# Patient Record
Sex: Female | Born: 1970 | Race: Black or African American | Hispanic: No | Marital: Single | Smoking: Never smoker
Health system: Southern US, Community
[De-identification: ages and names within clinical notes are randomized; demographics above are authoritative.]

## PROBLEM LIST (undated history)

## (undated) DIAGNOSIS — N200 Calculus of kidney: Secondary | ICD-10-CM

## (undated) DIAGNOSIS — F32A Depression, unspecified: Secondary | ICD-10-CM

## (undated) DIAGNOSIS — E119 Type 2 diabetes mellitus without complications: Secondary | ICD-10-CM

## (undated) DIAGNOSIS — K589 Irritable bowel syndrome without diarrhea: Secondary | ICD-10-CM

## (undated) DIAGNOSIS — C8590 Non-Hodgkin lymphoma, unspecified, unspecified site: Secondary | ICD-10-CM

## (undated) DIAGNOSIS — F329 Major depressive disorder, single episode, unspecified: Secondary | ICD-10-CM

## (undated) HISTORY — PX: LAPAROSCOPY: SHX197

## (undated) HISTORY — PX: ABDOMINAL HYSTERECTOMY: SHX81

## (undated) HISTORY — PX: LITHOTRIPSY: SUR834

---

## 2001-08-21 ENCOUNTER — Emergency Department (HOSPITAL_COMMUNITY): Admission: EM | Admit: 2001-08-21 | Discharge: 2001-08-21 | Payer: Self-pay | Admitting: Emergency Medicine

## 2001-10-03 ENCOUNTER — Encounter: Payer: Self-pay | Admitting: Emergency Medicine

## 2001-10-03 ENCOUNTER — Emergency Department (HOSPITAL_COMMUNITY): Admission: EM | Admit: 2001-10-03 | Discharge: 2001-10-03 | Payer: Self-pay | Admitting: Emergency Medicine

## 2002-01-30 ENCOUNTER — Other Ambulatory Visit: Admission: RE | Admit: 2002-01-30 | Discharge: 2002-01-30 | Payer: Self-pay | Admitting: Family Medicine

## 2002-03-04 ENCOUNTER — Emergency Department (HOSPITAL_COMMUNITY): Admission: EM | Admit: 2002-03-04 | Discharge: 2002-03-05 | Payer: Self-pay

## 2002-04-22 ENCOUNTER — Encounter: Admission: RE | Admit: 2002-04-22 | Discharge: 2002-04-22 | Payer: Self-pay | Admitting: Internal Medicine

## 2002-04-22 ENCOUNTER — Encounter: Payer: Self-pay | Admitting: Internal Medicine

## 2002-05-01 ENCOUNTER — Emergency Department (HOSPITAL_COMMUNITY): Admission: EM | Admit: 2002-05-01 | Discharge: 2002-05-02 | Payer: Self-pay | Admitting: Emergency Medicine

## 2002-05-20 ENCOUNTER — Inpatient Hospital Stay (HOSPITAL_COMMUNITY): Admission: AD | Admit: 2002-05-20 | Discharge: 2002-05-21 | Payer: Self-pay | Admitting: Obstetrics and Gynecology

## 2002-05-21 ENCOUNTER — Encounter: Payer: Self-pay | Admitting: Obstetrics

## 2002-07-20 ENCOUNTER — Emergency Department (HOSPITAL_COMMUNITY): Admission: EM | Admit: 2002-07-20 | Discharge: 2002-07-20 | Payer: Self-pay | Admitting: Emergency Medicine

## 2002-07-26 ENCOUNTER — Emergency Department (HOSPITAL_COMMUNITY): Admission: EM | Admit: 2002-07-26 | Discharge: 2002-07-27 | Payer: Self-pay | Admitting: *Deleted

## 2002-08-27 ENCOUNTER — Encounter: Admission: RE | Admit: 2002-08-27 | Discharge: 2002-08-27 | Payer: Self-pay | Admitting: Family Medicine

## 2002-11-17 ENCOUNTER — Emergency Department (HOSPITAL_COMMUNITY): Admission: EM | Admit: 2002-11-17 | Discharge: 2002-11-17 | Payer: Self-pay | Admitting: Emergency Medicine

## 2003-09-14 ENCOUNTER — Emergency Department (HOSPITAL_COMMUNITY): Admission: EM | Admit: 2003-09-14 | Discharge: 2003-09-14 | Payer: Self-pay | Admitting: Emergency Medicine

## 2003-12-25 ENCOUNTER — Emergency Department (HOSPITAL_COMMUNITY): Admission: EM | Admit: 2003-12-25 | Discharge: 2003-12-25 | Payer: Self-pay | Admitting: Emergency Medicine

## 2004-01-12 ENCOUNTER — Emergency Department (HOSPITAL_COMMUNITY): Admission: EM | Admit: 2004-01-12 | Discharge: 2004-01-12 | Payer: Self-pay | Admitting: Family Medicine

## 2004-01-13 ENCOUNTER — Emergency Department (HOSPITAL_COMMUNITY): Admission: EM | Admit: 2004-01-13 | Discharge: 2004-01-13 | Payer: Self-pay | Admitting: Family Medicine

## 2004-02-02 ENCOUNTER — Inpatient Hospital Stay (HOSPITAL_COMMUNITY): Admission: AD | Admit: 2004-02-02 | Discharge: 2004-02-02 | Payer: Self-pay | Admitting: Unknown Physician Specialty

## 2004-02-16 ENCOUNTER — Ambulatory Visit: Payer: Self-pay | Admitting: *Deleted

## 2004-04-14 ENCOUNTER — Ambulatory Visit: Payer: Self-pay | Admitting: Obstetrics and Gynecology

## 2004-04-16 ENCOUNTER — Emergency Department (HOSPITAL_COMMUNITY): Admission: EM | Admit: 2004-04-16 | Discharge: 2004-04-16 | Payer: Self-pay | Admitting: Family Medicine

## 2004-05-13 ENCOUNTER — Ambulatory Visit: Payer: Self-pay | Admitting: Family Medicine

## 2004-05-15 ENCOUNTER — Inpatient Hospital Stay (HOSPITAL_COMMUNITY): Admission: AD | Admit: 2004-05-15 | Discharge: 2004-05-15 | Payer: Self-pay | Admitting: Family Medicine

## 2004-05-20 ENCOUNTER — Inpatient Hospital Stay (HOSPITAL_COMMUNITY): Admission: AD | Admit: 2004-05-20 | Discharge: 2004-05-20 | Payer: Self-pay | Admitting: Obstetrics and Gynecology

## 2004-05-31 ENCOUNTER — Emergency Department (HOSPITAL_COMMUNITY): Admission: EM | Admit: 2004-05-31 | Discharge: 2004-05-31 | Payer: Self-pay | Admitting: Family Medicine

## 2004-06-07 ENCOUNTER — Ambulatory Visit: Payer: Self-pay | Admitting: Obstetrics and Gynecology

## 2004-06-14 ENCOUNTER — Ambulatory Visit: Payer: Self-pay | Admitting: Family Medicine

## 2004-06-17 ENCOUNTER — Ambulatory Visit: Payer: Self-pay | Admitting: *Deleted

## 2004-06-20 ENCOUNTER — Emergency Department (HOSPITAL_COMMUNITY): Admission: EM | Admit: 2004-06-20 | Discharge: 2004-06-20 | Payer: Self-pay | Admitting: Family Medicine

## 2004-06-28 ENCOUNTER — Ambulatory Visit: Payer: Self-pay | Admitting: *Deleted

## 2004-07-15 ENCOUNTER — Inpatient Hospital Stay (HOSPITAL_COMMUNITY): Admission: AD | Admit: 2004-07-15 | Discharge: 2004-07-16 | Payer: Self-pay | Admitting: Obstetrics and Gynecology

## 2004-08-12 ENCOUNTER — Emergency Department (HOSPITAL_COMMUNITY): Admission: EM | Admit: 2004-08-12 | Discharge: 2004-08-12 | Payer: Self-pay | Admitting: Family Medicine

## 2004-09-12 ENCOUNTER — Inpatient Hospital Stay (HOSPITAL_COMMUNITY): Admission: AD | Admit: 2004-09-12 | Discharge: 2004-09-12 | Payer: Self-pay | Admitting: Obstetrics & Gynecology

## 2004-09-23 ENCOUNTER — Emergency Department (HOSPITAL_COMMUNITY): Admission: EM | Admit: 2004-09-23 | Discharge: 2004-09-23 | Payer: Self-pay | Admitting: Family Medicine

## 2004-10-08 ENCOUNTER — Emergency Department (HOSPITAL_COMMUNITY): Admission: EM | Admit: 2004-10-08 | Discharge: 2004-10-08 | Payer: Self-pay | Admitting: Family Medicine

## 2004-11-14 ENCOUNTER — Emergency Department (HOSPITAL_COMMUNITY): Admission: EM | Admit: 2004-11-14 | Discharge: 2004-11-14 | Payer: Self-pay | Admitting: Emergency Medicine

## 2004-12-16 ENCOUNTER — Emergency Department (HOSPITAL_COMMUNITY): Admission: EM | Admit: 2004-12-16 | Discharge: 2004-12-16 | Payer: Self-pay | Admitting: Family Medicine

## 2005-01-09 ENCOUNTER — Emergency Department (HOSPITAL_COMMUNITY): Admission: EM | Admit: 2005-01-09 | Discharge: 2005-01-09 | Payer: Self-pay | Admitting: Emergency Medicine

## 2005-01-24 ENCOUNTER — Encounter: Payer: Self-pay | Admitting: Obstetrics and Gynecology

## 2005-01-24 ENCOUNTER — Ambulatory Visit: Payer: Self-pay | Admitting: Obstetrics and Gynecology

## 2005-01-28 ENCOUNTER — Emergency Department (HOSPITAL_COMMUNITY): Admission: EM | Admit: 2005-01-28 | Discharge: 2005-01-28 | Payer: Self-pay | Admitting: Family Medicine

## 2005-02-14 ENCOUNTER — Ambulatory Visit: Payer: Self-pay | Admitting: Internal Medicine

## 2005-03-08 ENCOUNTER — Emergency Department (HOSPITAL_COMMUNITY): Admission: AD | Admit: 2005-03-08 | Discharge: 2005-03-08 | Payer: Self-pay | Admitting: Family Medicine

## 2005-06-11 ENCOUNTER — Emergency Department (HOSPITAL_COMMUNITY): Admission: EM | Admit: 2005-06-11 | Discharge: 2005-06-11 | Payer: Self-pay | Admitting: Emergency Medicine

## 2005-06-28 ENCOUNTER — Ambulatory Visit: Payer: Self-pay | Admitting: Family Medicine

## 2005-07-05 ENCOUNTER — Ambulatory Visit (HOSPITAL_COMMUNITY): Admission: RE | Admit: 2005-07-05 | Discharge: 2005-07-05 | Payer: Self-pay | Admitting: Obstetrics and Gynecology

## 2005-07-26 ENCOUNTER — Ambulatory Visit: Payer: Self-pay | Admitting: Obstetrics and Gynecology

## 2005-08-03 ENCOUNTER — Emergency Department (HOSPITAL_COMMUNITY): Admission: EM | Admit: 2005-08-03 | Discharge: 2005-08-03 | Payer: Self-pay | Admitting: Pediatrics

## 2005-08-08 ENCOUNTER — Ambulatory Visit (HOSPITAL_COMMUNITY): Admission: RE | Admit: 2005-08-08 | Discharge: 2005-08-08 | Payer: Self-pay | Admitting: Emergency Medicine

## 2005-08-21 ENCOUNTER — Encounter (INDEPENDENT_AMBULATORY_CARE_PROVIDER_SITE_OTHER): Payer: Self-pay | Admitting: *Deleted

## 2005-08-21 ENCOUNTER — Inpatient Hospital Stay (HOSPITAL_COMMUNITY): Admission: RE | Admit: 2005-08-21 | Discharge: 2005-08-23 | Payer: Self-pay | Admitting: Obstetrics and Gynecology

## 2005-08-21 ENCOUNTER — Ambulatory Visit: Payer: Self-pay | Admitting: Family Medicine

## 2005-08-28 ENCOUNTER — Inpatient Hospital Stay (HOSPITAL_COMMUNITY): Admission: AD | Admit: 2005-08-28 | Discharge: 2005-08-28 | Payer: Self-pay | Admitting: Gynecology

## 2005-09-07 ENCOUNTER — Ambulatory Visit: Payer: Self-pay | Admitting: Obstetrics and Gynecology

## 2005-10-23 ENCOUNTER — Emergency Department (HOSPITAL_COMMUNITY): Admission: EM | Admit: 2005-10-23 | Discharge: 2005-10-23 | Payer: Self-pay | Admitting: Emergency Medicine

## 2005-10-26 ENCOUNTER — Ambulatory Visit: Payer: Self-pay | Admitting: Obstetrics and Gynecology

## 2005-11-22 ENCOUNTER — Emergency Department (HOSPITAL_COMMUNITY): Admission: EM | Admit: 2005-11-22 | Discharge: 2005-11-23 | Payer: Self-pay | Admitting: Emergency Medicine

## 2005-12-24 ENCOUNTER — Emergency Department (HOSPITAL_COMMUNITY): Admission: EM | Admit: 2005-12-24 | Discharge: 2005-12-24 | Payer: Self-pay | Admitting: Emergency Medicine

## 2006-02-22 ENCOUNTER — Emergency Department (HOSPITAL_COMMUNITY): Admission: EM | Admit: 2006-02-22 | Discharge: 2006-02-22 | Payer: Self-pay | Admitting: Family Medicine

## 2006-02-26 IMAGING — US US TRANSVAGINAL NON-OB
1 series · 18 of 25 positions shown · non-contrast
Comparison: none

CLINICAL DATA: Pelvic pain and enlarged uterus. 
 TRANSABDOMINAL AND TRANSVAGINAL PELVIC ULTRASOUND:
 No available comparisons. 
 The uterus measures 9.0 x 4.5 x 5.2 cm.  There is mild heterogeneity but no focal abnormality.  The endometrium is normal at 9.7 mm.  The right ovary is normal measuring 2.6 x 1.5 x 3.5 cm.  The left ovary is enlarged measuring 4.7 x 3.3 x 3.5 cm.  The left ovary contains a simple-appearing cyst measuring 3.7 x 3.1 x 3.2 cm.  No free fluid.  No other adnexal abnormality.

[Series 1: us transvaginal non-ob · 18 of 61 slices shown]
[im 1/61]
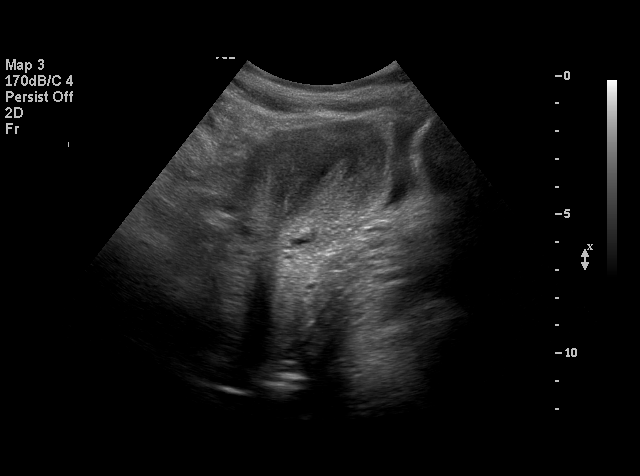
[im 6/61]
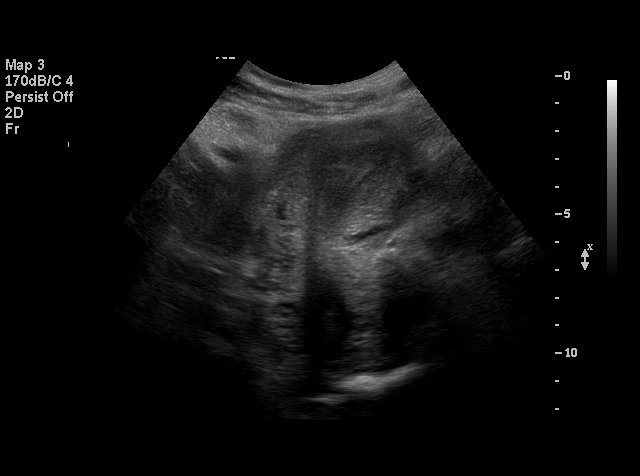
[im 8/61]
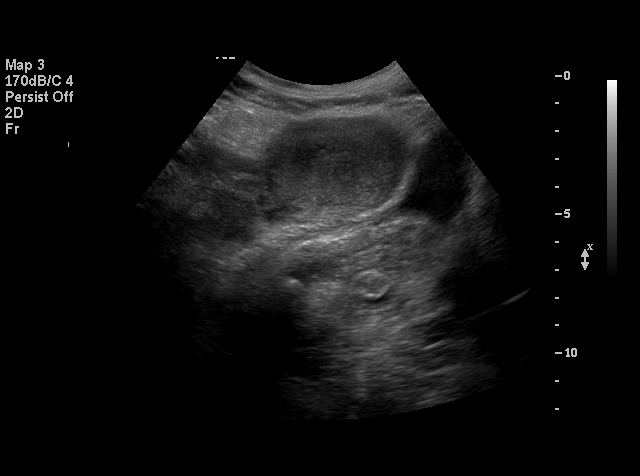
[im 11/61]
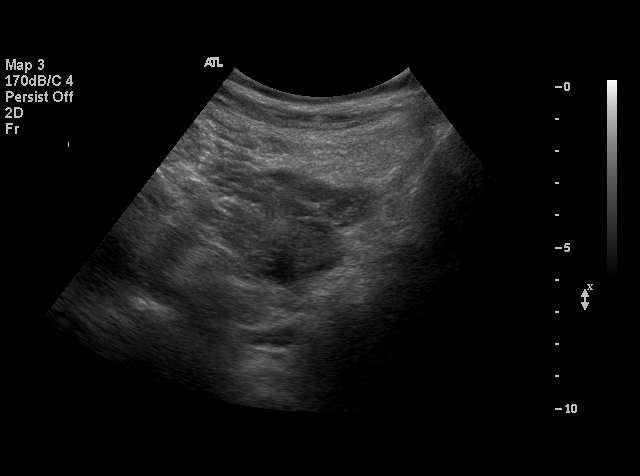
[im 16/61]
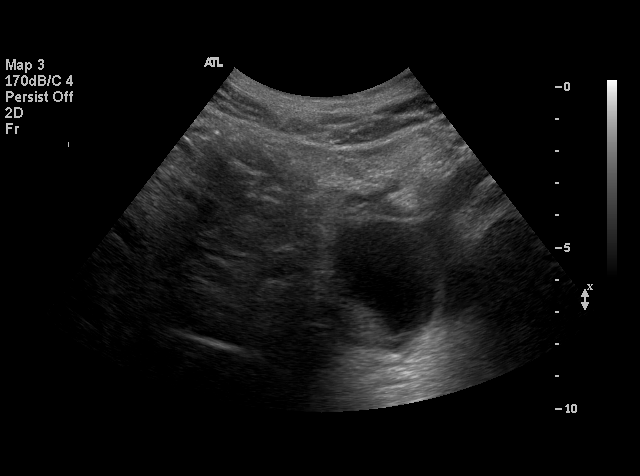
[im 18/61]
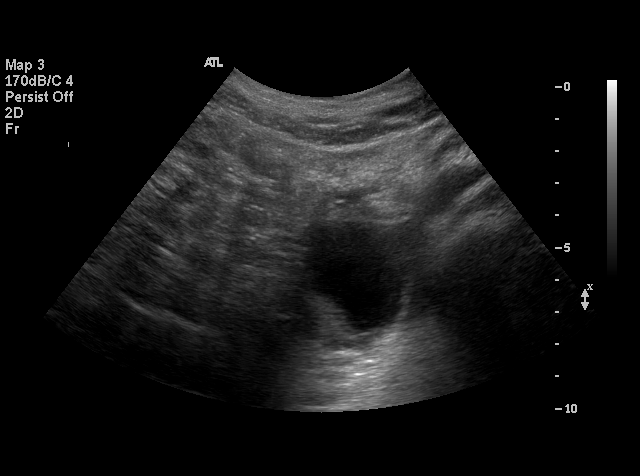
[im 23/61]
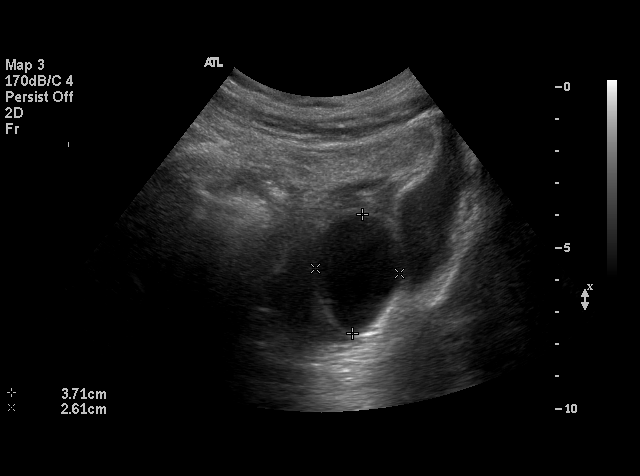
[im 26/61]
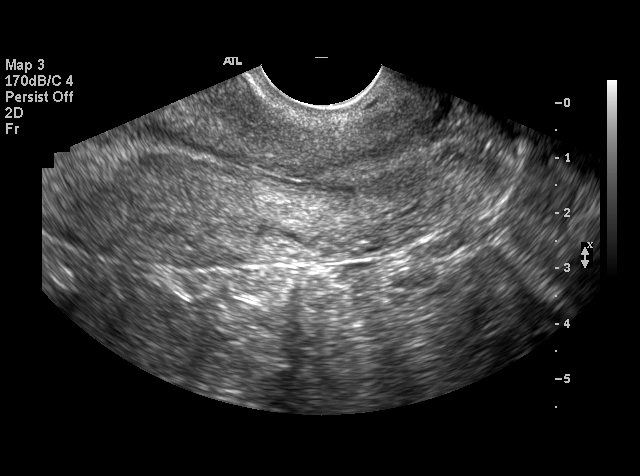
[im 28/61]
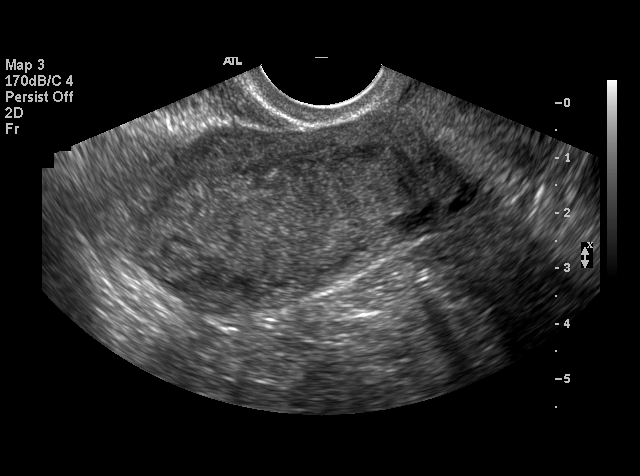
[im 33/61]
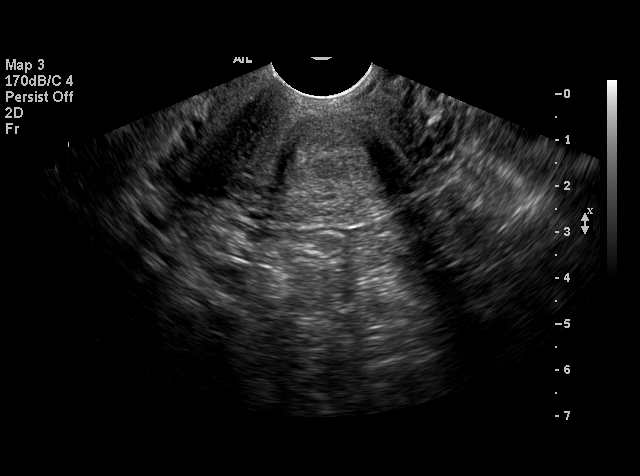
[im 36/61]
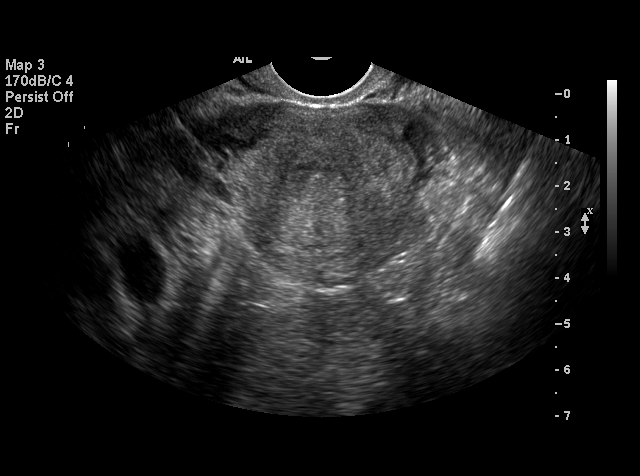
[im 38/61]
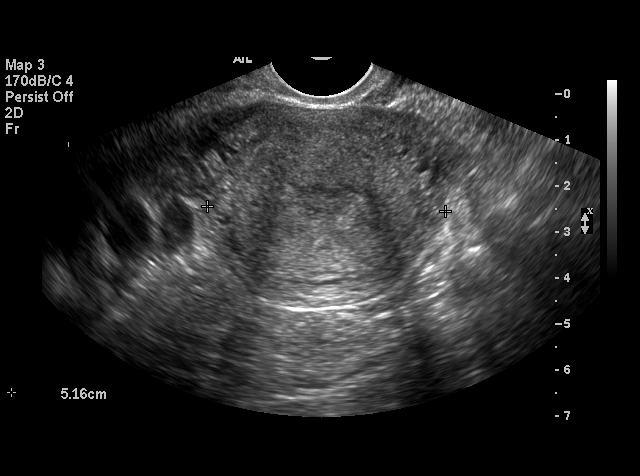
[im 43/61]
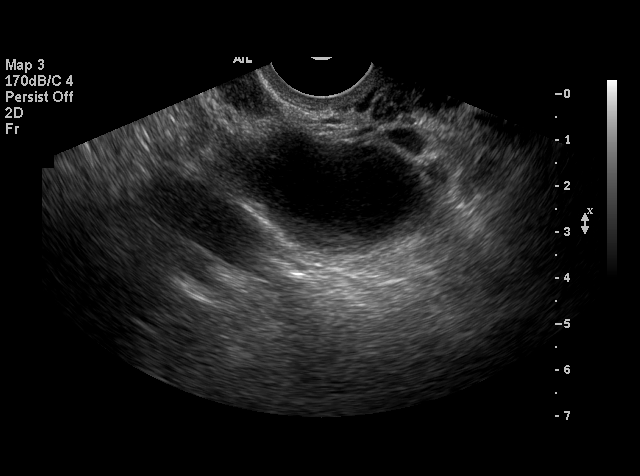
[im 46/61]
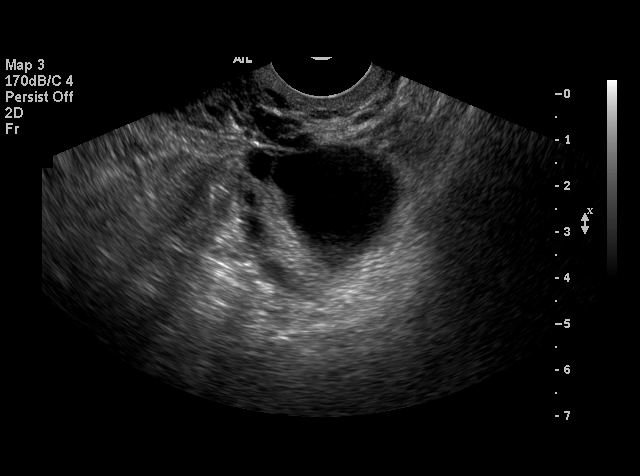
[im 51/61]
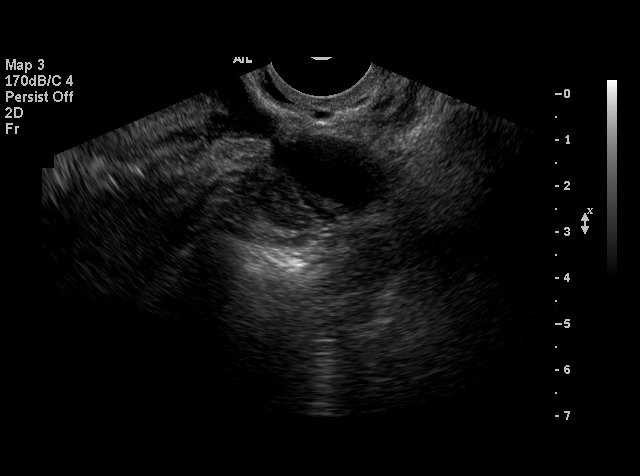
[im 53/61]
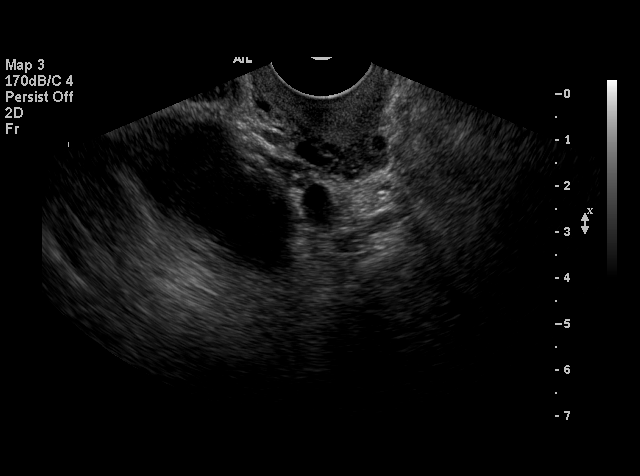
[im 56/61]
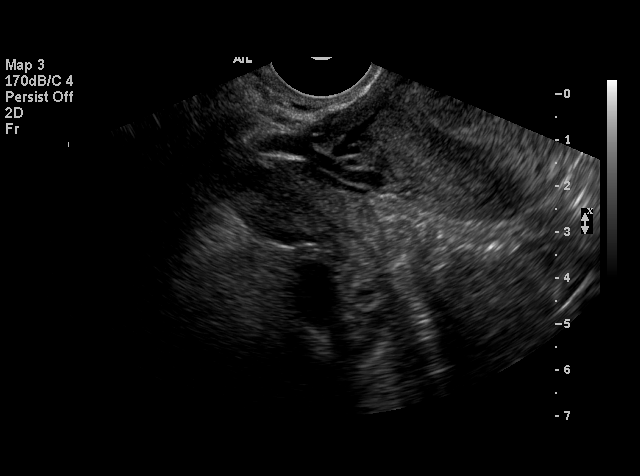
[im 61/61]
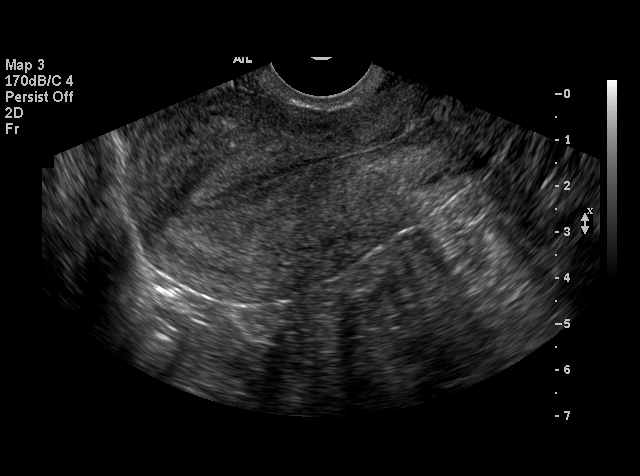

[18 of 25 positions shown; findings below may reference images not displayed]

IMPRESSION: 1.  3.7 x 3 cm simple left ovarian cyst.  Otherwise normal pelvic ultrasound.

## 2006-05-03 ENCOUNTER — Emergency Department (HOSPITAL_COMMUNITY): Admission: EM | Admit: 2006-05-03 | Discharge: 2006-05-03 | Payer: Self-pay | Admitting: Emergency Medicine

## 2006-05-14 ENCOUNTER — Ambulatory Visit: Payer: Self-pay | Admitting: Family Medicine

## 2006-05-14 ENCOUNTER — Encounter: Payer: Self-pay | Admitting: Family Medicine

## 2006-05-14 LAB — CONVERTED CEMR LAB
CO2: 28 meq/L (ref 19–32)
Chloride: 103 meq/L (ref 96–112)
Potassium: 4.5 meq/L (ref 3.5–5.3)

## 2006-06-23 ENCOUNTER — Telehealth: Payer: Self-pay | Admitting: Family Medicine

## 2006-06-26 ENCOUNTER — Telehealth: Payer: Self-pay | Admitting: *Deleted

## 2006-07-13 ENCOUNTER — Telehealth (INDEPENDENT_AMBULATORY_CARE_PROVIDER_SITE_OTHER): Payer: Self-pay | Admitting: *Deleted

## 2006-07-17 ENCOUNTER — Ambulatory Visit: Payer: Self-pay | Admitting: Family Medicine

## 2006-07-17 ENCOUNTER — Encounter: Payer: Self-pay | Admitting: Family Medicine

## 2006-07-17 LAB — CONVERTED CEMR LAB
Anti Nuclear Antibody(ANA): NEGATIVE
BUN: 12 mg/dL (ref 6–23)
Calcium: 9.8 mg/dL (ref 8.4–10.5)
Chloride: 102 meq/L (ref 96–112)
Creatinine, Ser: 0.99 mg/dL (ref 0.40–1.20)
DHEA-SO4: 133 ug/dL (ref 35–430)
HCT: 35.5 %
Hemoglobin: 12 g/dL
MCV: 82.4 fL
Platelets: 186 10*3/uL
RBC: 4.31 M/uL
Testosterone: 26.93 ng/dL (ref 10–70)
WBC: 5.8 10*3/uL

## 2006-07-19 ENCOUNTER — Encounter: Payer: Self-pay | Admitting: Family Medicine

## 2006-08-30 ENCOUNTER — Telehealth: Payer: Self-pay | Admitting: Family Medicine

## 2006-09-20 ENCOUNTER — Encounter: Payer: Self-pay | Admitting: *Deleted

## 2006-10-01 ENCOUNTER — Telehealth: Payer: Self-pay | Admitting: *Deleted

## 2006-10-01 ENCOUNTER — Ambulatory Visit: Payer: Self-pay | Admitting: Family Medicine

## 2006-10-05 ENCOUNTER — Ambulatory Visit: Payer: Self-pay | Admitting: Family Medicine

## 2006-10-05 ENCOUNTER — Telehealth: Payer: Self-pay | Admitting: *Deleted

## 2006-11-04 ENCOUNTER — Emergency Department (HOSPITAL_COMMUNITY): Admission: EM | Admit: 2006-11-04 | Discharge: 2006-11-04 | Payer: Self-pay | Admitting: Emergency Medicine

## 2006-11-08 ENCOUNTER — Telehealth (INDEPENDENT_AMBULATORY_CARE_PROVIDER_SITE_OTHER): Payer: Self-pay | Admitting: *Deleted

## 2006-12-05 ENCOUNTER — Encounter (INDEPENDENT_AMBULATORY_CARE_PROVIDER_SITE_OTHER): Payer: Self-pay | Admitting: Family Medicine

## 2006-12-05 ENCOUNTER — Ambulatory Visit: Payer: Self-pay | Admitting: Family Medicine

## 2006-12-09 ENCOUNTER — Encounter (INDEPENDENT_AMBULATORY_CARE_PROVIDER_SITE_OTHER): Payer: Self-pay | Admitting: Family Medicine

## 2006-12-09 LAB — CONVERTED CEMR LAB
Basophils Absolute: 0 10*3/uL (ref 0.0–0.1)
Basophils Relative: 0 % (ref 0–1)
Eosinophils Absolute: 0.2 10*3/uL (ref 0.0–0.7)
MCHC: 31.5 g/dL (ref 30.0–36.0)
MCV: 85 fL (ref 78.0–100.0)
Monocytes Relative: 5 % (ref 3–11)
Neutrophils Relative %: 56 % (ref 43–77)
RBC: 4 M/uL (ref 3.87–5.11)
RDW: 14.1 % — ABNORMAL HIGH (ref 11.5–14.0)

## 2006-12-10 ENCOUNTER — Telehealth (INDEPENDENT_AMBULATORY_CARE_PROVIDER_SITE_OTHER): Payer: Self-pay | Admitting: *Deleted

## 2007-02-21 ENCOUNTER — Telehealth: Payer: Self-pay | Admitting: *Deleted

## 2007-04-16 ENCOUNTER — Ambulatory Visit: Payer: Self-pay | Admitting: Family Medicine

## 2007-04-22 ENCOUNTER — Ambulatory Visit: Payer: Self-pay | Admitting: Family Medicine

## 2007-04-29 ENCOUNTER — Ambulatory Visit: Payer: Self-pay | Admitting: Sports Medicine

## 2007-05-01 ENCOUNTER — Ambulatory Visit: Payer: Self-pay | Admitting: Family Medicine

## 2007-05-17 ENCOUNTER — Telehealth: Payer: Self-pay | Admitting: *Deleted

## 2007-05-22 ENCOUNTER — Ambulatory Visit: Payer: Self-pay | Admitting: Family Medicine

## 2007-05-22 LAB — CONVERTED CEMR LAB
Blood Glucose, AC Bkfst: 137 mg/dL
Glucose, Urine, Semiquant: NEGATIVE
Ketones, urine, test strip: NEGATIVE
Nitrite: NEGATIVE
Specific Gravity, Urine: >=1.03

## 2007-05-24 ENCOUNTER — Ambulatory Visit: Payer: Self-pay | Admitting: Family Medicine

## 2007-05-24 ENCOUNTER — Telehealth: Payer: Self-pay | Admitting: *Deleted

## 2007-05-24 LAB — CONVERTED CEMR LAB
Bilirubin Urine: NEGATIVE
Glucose, Urine, Semiquant: NEGATIVE
Protein, U semiquant: 30
Specific Gravity, Urine: 1.025
WBC Urine, dipstick: NEGATIVE
pH: 6

## 2007-07-11 ENCOUNTER — Ambulatory Visit: Payer: Self-pay | Admitting: Family Medicine

## 2007-07-12 ENCOUNTER — Telehealth: Payer: Self-pay | Admitting: *Deleted

## 2007-07-12 ENCOUNTER — Ambulatory Visit: Payer: Self-pay | Admitting: Family Medicine

## 2007-07-18 ENCOUNTER — Encounter (INDEPENDENT_AMBULATORY_CARE_PROVIDER_SITE_OTHER): Payer: Self-pay | Admitting: Family Medicine

## 2007-07-22 ENCOUNTER — Telehealth: Payer: Self-pay | Admitting: *Deleted

## 2007-07-26 ENCOUNTER — Ambulatory Visit: Payer: Self-pay | Admitting: Family Medicine

## 2007-07-26 ENCOUNTER — Encounter (INDEPENDENT_AMBULATORY_CARE_PROVIDER_SITE_OTHER): Payer: Self-pay | Admitting: Family Medicine

## 2007-07-26 LAB — CONVERTED CEMR LAB
Albumin: 4.5 g/dL (ref 3.5–5.2)
Alkaline Phosphatase: 59 units/L (ref 39–117)
BUN: 10 mg/dL (ref 6–23)
CO2: 25 meq/L (ref 19–32)
Eosinophils Absolute: 0.2 10*3/uL (ref 0.0–0.7)
Eosinophils Relative: 2 % (ref 0–5)
Glucose, Bld: 91 mg/dL (ref 70–99)
H Pylori IgG: NEGATIVE
HCT: 38.5 % (ref 36.0–46.0)
Hemoglobin: 11.8 g/dL — ABNORMAL LOW (ref 12.0–15.0)
Lymphocytes Relative: 41 % (ref 12–46)
Lymphs Abs: 2.7 10*3/uL (ref 0.7–4.0)
MCV: 86.1 fL (ref 78.0–100.0)
Monocytes Absolute: 0.5 10*3/uL (ref 0.1–1.0)
Monocytes Relative: 7 % (ref 3–12)
RBC: 4.47 M/uL (ref 3.87–5.11)
Sodium: 141 meq/L (ref 135–145)
TSH: 0.42 microintl units/mL (ref 0.350–5.50)
Total Bilirubin: 0.3 mg/dL (ref 0.3–1.2)
Total Protein: 7.3 g/dL (ref 6.0–8.3)
WBC: 6.7 10*3/uL (ref 4.0–10.5)

## 2007-07-30 ENCOUNTER — Ambulatory Visit: Payer: Self-pay | Admitting: Family Medicine

## 2007-08-21 ENCOUNTER — Encounter (INDEPENDENT_AMBULATORY_CARE_PROVIDER_SITE_OTHER): Payer: Self-pay | Admitting: *Deleted

## 2007-09-02 ENCOUNTER — Encounter (INDEPENDENT_AMBULATORY_CARE_PROVIDER_SITE_OTHER): Payer: Self-pay | Admitting: *Deleted

## 2007-09-02 IMAGING — US US TRANSVAGINAL NON-OB
1 series · 13 of 25 positions shown · non-contrast
Comparison: Pelvic ultrasound 07/05/2005.

CLINICAL DATA: Pelvic pain. History of a hemorrhagic 5 cm right ovarian cyst
approximately one month ago.

TRANSABDOMINAL AND TRANSVAGINAL PELVIC ULTRASOUND 08/08/2005:
TECHNIQUE: Initially, transabdominal imaging was performed using the bladder as
an acoustical window. Subsequently, the bladder was emptied and transvaginal
imaging was performed.

[Series 1: unknown · 0.27mm/px · 13 of 87 slices shown]
[im 1/87]
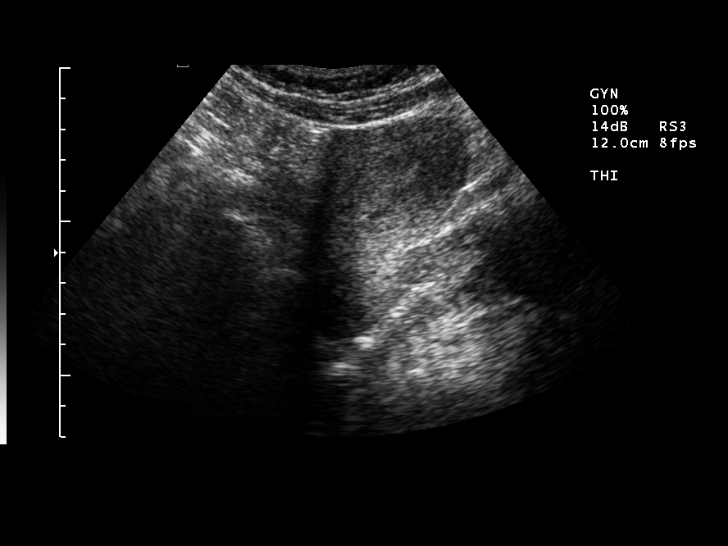
[im 8/87]
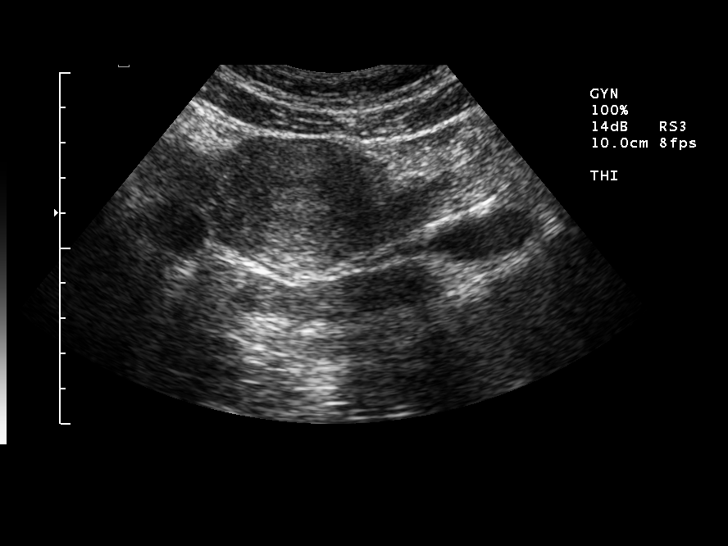
[im 15/87]
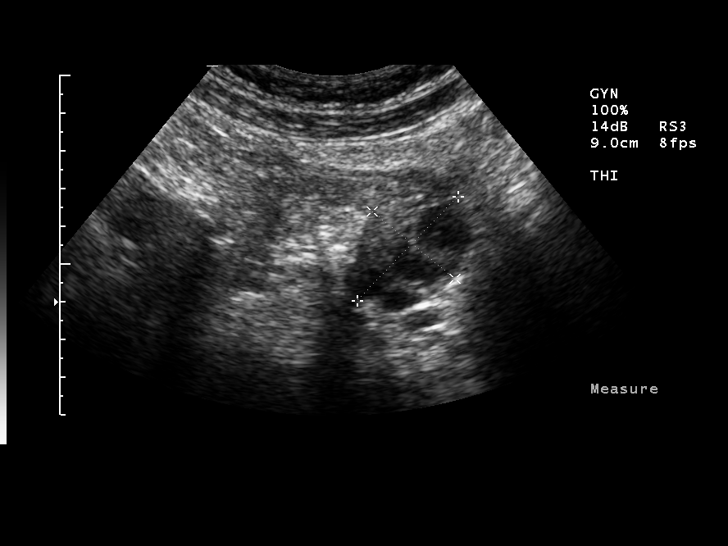
[im 22/87]
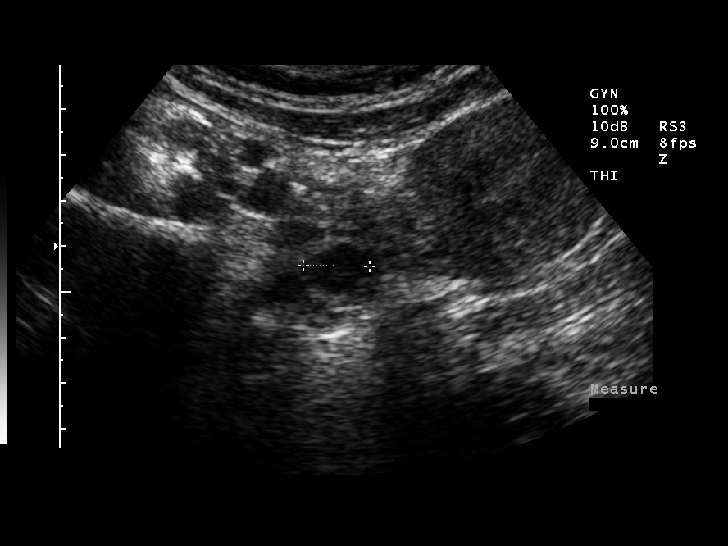
[im 29/87]
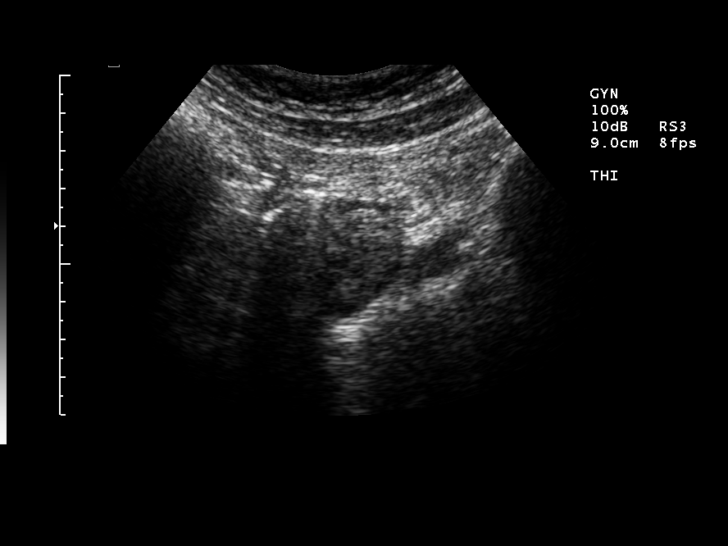
[im 36/87]
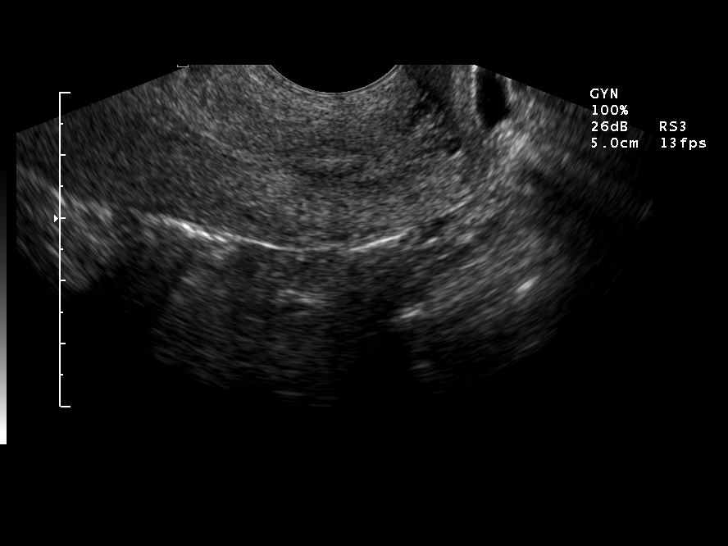
[im 44/87]
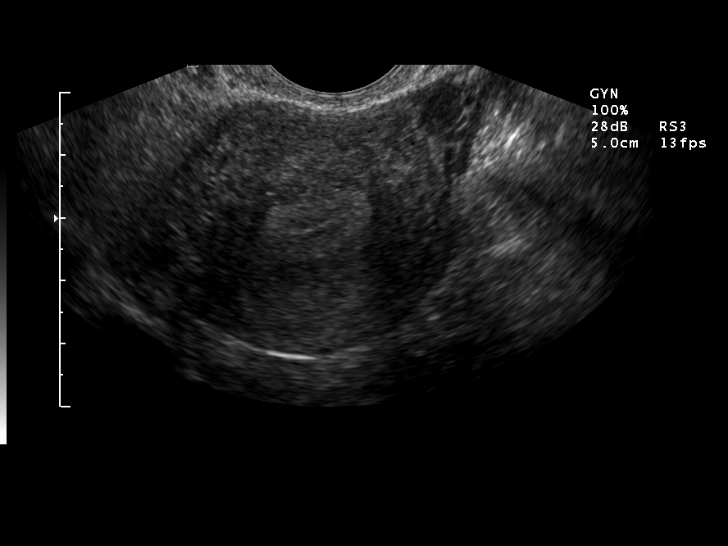
[im 51/87]
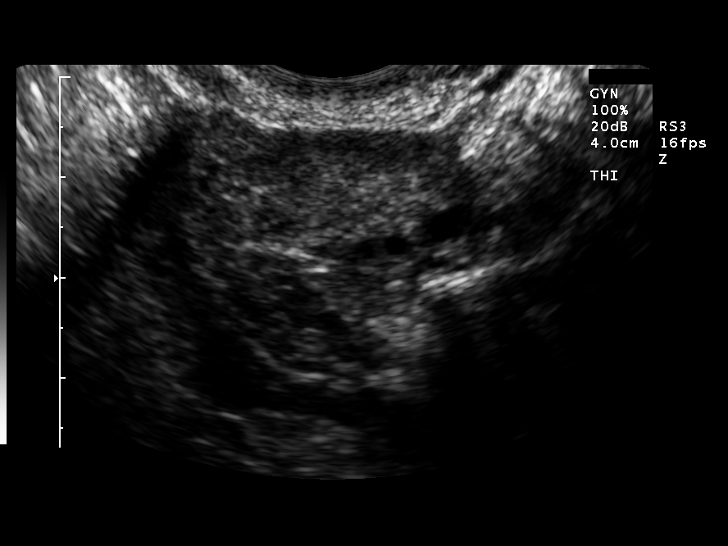
[im 58/87]
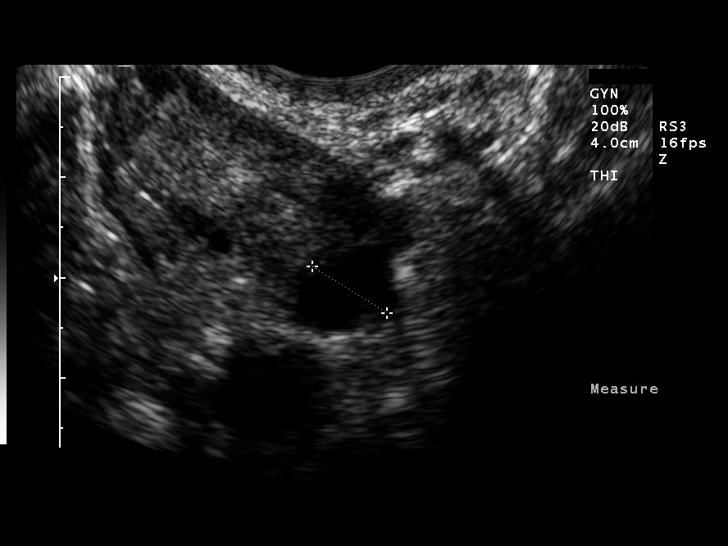
[im 65/87]
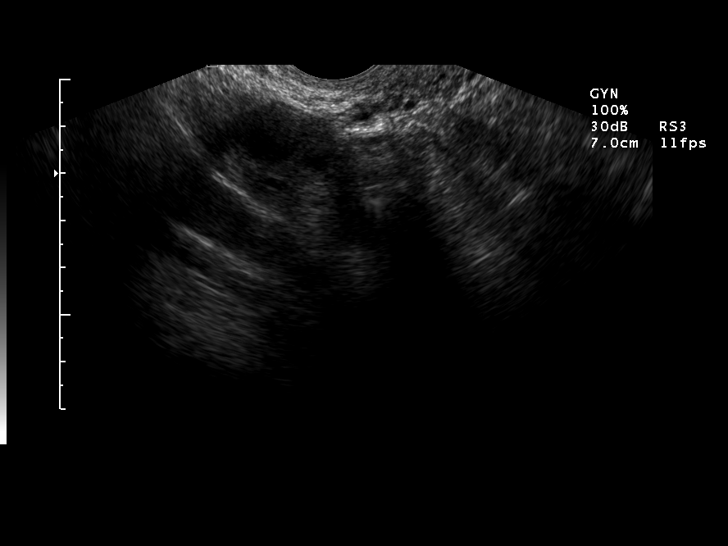
[im 72/87]
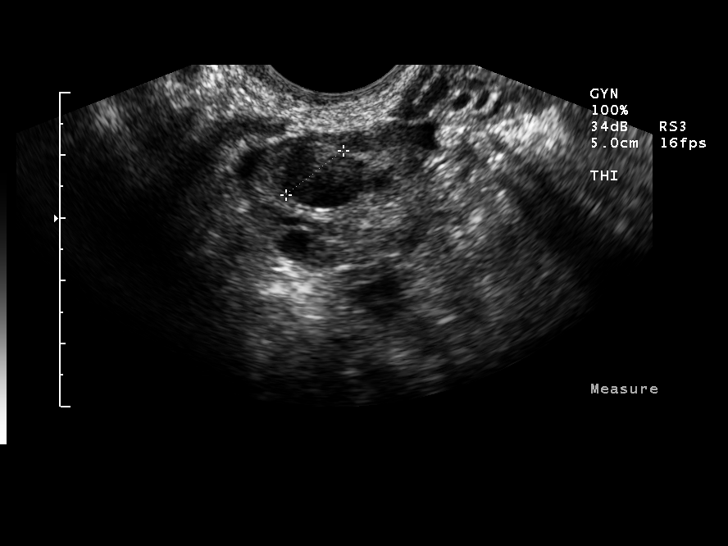
[im 79/87]
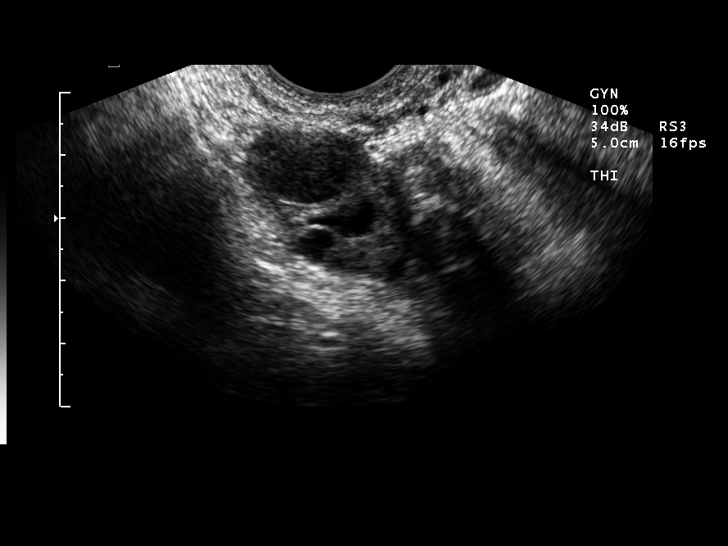
[im 87/87]
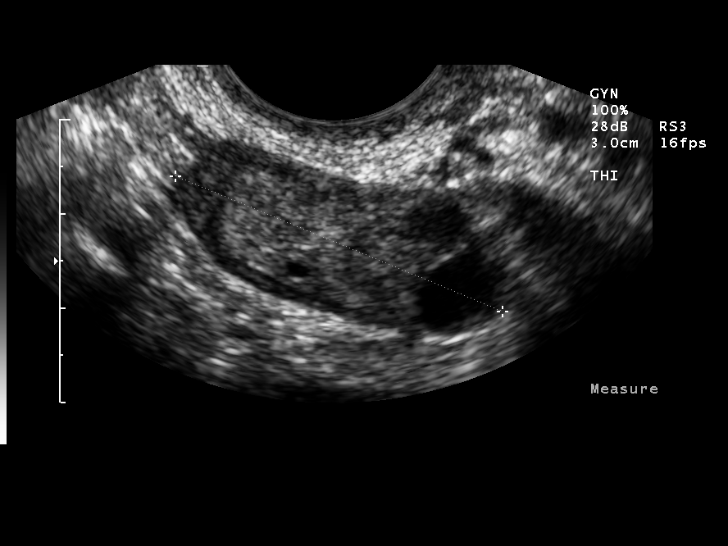

[13 of 25 positions shown; findings below may reference images not displayed]

FINDINGS: The hemorrhagic cyst in the right ovary has involuted since the
previous study. It now measures approximately 1.4 x 1.4 x 1.6 cm. There is a
second complex, hemorrhagic cyst in the right ovary which measures approximately
1.4 x  1.3 x 1.2 cm. There are other small follicular cysts in the right ovary.
The ovary itself remains normal in size measuring 4.1 x 2.1 x 2.5 cm.

The left ovary is normal in size, measuring approximately 3.0 x 1.7 x 1.8 cm. It
contains several follicular cysts, the largest approximating 1 cm.

There are no other adnexal masses. Trace physiologic free fluid is noted in the
cul-de-sac.

The uterus is normal in size and appearance and measures approximately 8.2 x
x 5.0 cm. The endometrium is normal and trilayered it measures approximately 9
mm in thickness.
IMPRESSION: 1. Involution of the large hemorrhagic right ovarian cyst since the prior
ultrasound 07/05/2005. The cyst is now under 2 cm in size.
2. There is a second small hemorrhagic right ovarian cyst, also less than 2 cm
in size.
3. Normal pelvic ultrasound otherwise.

## 2007-09-05 ENCOUNTER — Encounter (INDEPENDENT_AMBULATORY_CARE_PROVIDER_SITE_OTHER): Payer: Self-pay | Admitting: *Deleted

## 2007-10-02 ENCOUNTER — Telehealth: Payer: Self-pay | Admitting: *Deleted

## 2007-10-03 ENCOUNTER — Encounter: Payer: Self-pay | Admitting: Family Medicine

## 2007-10-03 ENCOUNTER — Ambulatory Visit: Payer: Self-pay | Admitting: Family Medicine

## 2007-10-03 ENCOUNTER — Emergency Department (HOSPITAL_COMMUNITY): Admission: EM | Admit: 2007-10-03 | Discharge: 2007-10-03 | Payer: Self-pay | Admitting: Emergency Medicine

## 2007-10-03 DIAGNOSIS — T7411XA Adult physical abuse, confirmed, initial encounter: Secondary | ICD-10-CM | POA: Insufficient documentation

## 2007-10-03 LAB — CONVERTED CEMR LAB: Whiff Test: NEGATIVE

## 2007-10-19 ENCOUNTER — Telehealth (INDEPENDENT_AMBULATORY_CARE_PROVIDER_SITE_OTHER): Payer: Self-pay | Admitting: Family Medicine

## 2007-10-25 ENCOUNTER — Emergency Department (HOSPITAL_COMMUNITY): Admission: EM | Admit: 2007-10-25 | Discharge: 2007-10-25 | Payer: Self-pay | Admitting: Emergency Medicine

## 2007-11-01 ENCOUNTER — Ambulatory Visit: Payer: Self-pay | Admitting: Family Medicine

## 2007-11-01 ENCOUNTER — Telehealth: Payer: Self-pay | Admitting: *Deleted

## 2007-11-15 ENCOUNTER — Telehealth: Payer: Self-pay | Admitting: *Deleted

## 2007-11-15 ENCOUNTER — Telehealth (INDEPENDENT_AMBULATORY_CARE_PROVIDER_SITE_OTHER): Payer: Self-pay | Admitting: *Deleted

## 2007-11-22 ENCOUNTER — Encounter: Payer: Self-pay | Admitting: *Deleted

## 2007-11-23 ENCOUNTER — Emergency Department (HOSPITAL_COMMUNITY): Admission: EM | Admit: 2007-11-23 | Discharge: 2007-11-24 | Payer: Self-pay | Admitting: Emergency Medicine

## 2007-12-13 ENCOUNTER — Ambulatory Visit (HOSPITAL_COMMUNITY): Admission: RE | Admit: 2007-12-13 | Discharge: 2007-12-13 | Payer: Self-pay | Admitting: Family Medicine

## 2007-12-13 ENCOUNTER — Ambulatory Visit: Payer: Self-pay | Admitting: Family Medicine

## 2007-12-13 ENCOUNTER — Encounter (INDEPENDENT_AMBULATORY_CARE_PROVIDER_SITE_OTHER): Payer: Self-pay | Admitting: Family Medicine

## 2007-12-13 ENCOUNTER — Emergency Department (HOSPITAL_COMMUNITY): Admission: EM | Admit: 2007-12-13 | Discharge: 2007-12-13 | Payer: Self-pay | Admitting: Emergency Medicine

## 2007-12-16 ENCOUNTER — Telehealth (INDEPENDENT_AMBULATORY_CARE_PROVIDER_SITE_OTHER): Payer: Self-pay | Admitting: Family Medicine

## 2007-12-23 ENCOUNTER — Encounter: Payer: Self-pay | Admitting: *Deleted

## 2008-01-08 ENCOUNTER — Encounter (INDEPENDENT_AMBULATORY_CARE_PROVIDER_SITE_OTHER): Payer: Self-pay | Admitting: *Deleted

## 2008-01-29 ENCOUNTER — Encounter (INDEPENDENT_AMBULATORY_CARE_PROVIDER_SITE_OTHER): Payer: Self-pay | Admitting: *Deleted

## 2008-02-07 ENCOUNTER — Encounter (INDEPENDENT_AMBULATORY_CARE_PROVIDER_SITE_OTHER): Payer: Self-pay | Admitting: Family Medicine

## 2008-02-07 ENCOUNTER — Ambulatory Visit: Payer: Self-pay | Admitting: Family Medicine

## 2008-02-07 DIAGNOSIS — E785 Hyperlipidemia, unspecified: Secondary | ICD-10-CM

## 2008-02-07 LAB — CONVERTED CEMR LAB
Blood in Urine, dipstick: NEGATIVE
Glucose, Urine, Semiquant: NEGATIVE
Nitrite: NEGATIVE
Specific Gravity, Urine: 1.02
WBC Urine, dipstick: NEGATIVE
Whiff Test: NEGATIVE
pH: 7

## 2008-02-10 ENCOUNTER — Encounter (INDEPENDENT_AMBULATORY_CARE_PROVIDER_SITE_OTHER): Payer: Self-pay | Admitting: Family Medicine

## 2008-02-27 ENCOUNTER — Telehealth: Payer: Self-pay | Admitting: *Deleted

## 2008-02-27 ENCOUNTER — Encounter (INDEPENDENT_AMBULATORY_CARE_PROVIDER_SITE_OTHER): Payer: Self-pay | Admitting: Family Medicine

## 2008-02-27 ENCOUNTER — Ambulatory Visit: Payer: Self-pay | Admitting: Family Medicine

## 2008-02-28 ENCOUNTER — Encounter (INDEPENDENT_AMBULATORY_CARE_PROVIDER_SITE_OTHER): Payer: Self-pay | Admitting: Family Medicine

## 2008-03-03 ENCOUNTER — Encounter (INDEPENDENT_AMBULATORY_CARE_PROVIDER_SITE_OTHER): Payer: Self-pay | Admitting: *Deleted

## 2008-04-01 ENCOUNTER — Encounter: Payer: Self-pay | Admitting: *Deleted

## 2008-04-04 ENCOUNTER — Emergency Department (HOSPITAL_COMMUNITY): Admission: EM | Admit: 2008-04-04 | Discharge: 2008-04-04 | Payer: Self-pay | Admitting: Emergency Medicine

## 2008-07-02 ENCOUNTER — Emergency Department (HOSPITAL_COMMUNITY): Admission: EM | Admit: 2008-07-02 | Discharge: 2008-07-02 | Payer: Self-pay | Admitting: Emergency Medicine

## 2008-07-10 ENCOUNTER — Ambulatory Visit: Payer: Self-pay | Admitting: Family Medicine

## 2008-07-17 ENCOUNTER — Ambulatory Visit: Payer: Self-pay

## 2008-08-10 ENCOUNTER — Encounter (INDEPENDENT_AMBULATORY_CARE_PROVIDER_SITE_OTHER): Payer: Self-pay | Admitting: Family Medicine

## 2008-09-17 ENCOUNTER — Encounter: Payer: Self-pay | Admitting: Family Medicine

## 2008-09-29 ENCOUNTER — Telehealth: Payer: Self-pay | Admitting: Family Medicine

## 2008-09-29 ENCOUNTER — Ambulatory Visit: Payer: Self-pay | Admitting: Family Medicine

## 2008-09-29 DIAGNOSIS — L210 Seborrhea capitis: Secondary | ICD-10-CM

## 2008-09-29 DIAGNOSIS — F339 Major depressive disorder, recurrent, unspecified: Secondary | ICD-10-CM | POA: Insufficient documentation

## 2008-09-29 LAB — CONVERTED CEMR LAB: Whiff Test: NEGATIVE

## 2008-10-06 ENCOUNTER — Telehealth: Payer: Self-pay | Admitting: *Deleted

## 2008-10-17 ENCOUNTER — Emergency Department (HOSPITAL_COMMUNITY): Admission: EM | Admit: 2008-10-17 | Discharge: 2008-10-17 | Payer: Self-pay | Admitting: Emergency Medicine

## 2008-10-20 ENCOUNTER — Telehealth: Payer: Self-pay | Admitting: Family Medicine

## 2008-11-09 ENCOUNTER — Emergency Department (HOSPITAL_COMMUNITY): Admission: EM | Admit: 2008-11-09 | Discharge: 2008-11-10 | Payer: Self-pay | Admitting: Emergency Medicine

## 2008-12-06 ENCOUNTER — Emergency Department (HOSPITAL_COMMUNITY): Admission: EM | Admit: 2008-12-06 | Discharge: 2008-12-06 | Payer: Self-pay | Admitting: Family Medicine

## 2008-12-24 ENCOUNTER — Emergency Department (HOSPITAL_COMMUNITY): Admission: EM | Admit: 2008-12-24 | Discharge: 2008-12-24 | Payer: Self-pay | Admitting: Emergency Medicine

## 2008-12-24 ENCOUNTER — Encounter: Payer: Self-pay | Admitting: Family Medicine

## 2009-01-26 ENCOUNTER — Encounter: Payer: Self-pay | Admitting: Family Medicine

## 2009-01-26 ENCOUNTER — Ambulatory Visit: Payer: Self-pay | Admitting: Family Medicine

## 2009-01-26 DIAGNOSIS — D649 Anemia, unspecified: Secondary | ICD-10-CM

## 2009-01-26 LAB — CONVERTED CEMR LAB
Nitrite: NEGATIVE
Protein, U semiquant: 30
Specific Gravity, Urine: >=1.03
Urobilinogen, UA: 0.2
pH: 6

## 2009-01-27 LAB — CONVERTED CEMR LAB
AST: 16 units/L (ref 0–37)
Alkaline Phosphatase: 69 units/L (ref 39–117)
Glucose, Bld: 110 mg/dL — ABNORMAL HIGH (ref 70–99)
HDL: 43 mg/dL (ref >39)
LDL Cholesterol: 145 mg/dL — ABNORMAL HIGH (ref 0–99)
MCHC: 29.9 g/dL — ABNORMAL LOW (ref 30.0–36.0)
MCV: 85.2 fL (ref 78.0–100.0)
Platelets: 252 10*3/uL (ref 150–400)
RBC: 4.12 M/uL (ref 3.87–5.11)
RDW: 14.9 % (ref 11.5–15.5)
Sodium: 143 meq/L (ref 135–145)
Total Bilirubin: 0.2 mg/dL — ABNORMAL LOW (ref 0.3–1.2)
Total Protein: 7.3 g/dL (ref 6.0–8.3)
Triglycerides: 155 mg/dL — ABNORMAL HIGH (ref <150)
VLDL: 31 mg/dL (ref 0–40)

## 2009-02-01 ENCOUNTER — Telehealth: Payer: Self-pay | Admitting: Family Medicine

## 2009-02-09 ENCOUNTER — Ambulatory Visit (HOSPITAL_COMMUNITY): Admission: RE | Admit: 2009-02-09 | Discharge: 2009-02-09 | Payer: Self-pay | Admitting: Family Medicine

## 2009-02-09 ENCOUNTER — Ambulatory Visit: Payer: Self-pay | Admitting: Family Medicine

## 2009-02-09 DIAGNOSIS — R599 Enlarged lymph nodes, unspecified: Secondary | ICD-10-CM | POA: Insufficient documentation

## 2009-03-11 ENCOUNTER — Ambulatory Visit: Payer: Self-pay | Admitting: Family Medicine

## 2009-04-22 ENCOUNTER — Telehealth: Payer: Self-pay | Admitting: Psychology

## 2009-05-16 ENCOUNTER — Encounter: Payer: Self-pay | Admitting: *Deleted

## 2009-08-12 ENCOUNTER — Telehealth: Payer: Self-pay | Admitting: *Deleted

## 2010-04-10 ENCOUNTER — Encounter: Payer: Self-pay | Admitting: *Deleted

## 2010-04-11 ENCOUNTER — Encounter: Payer: Self-pay | Admitting: Family Medicine

## 2010-04-17 LAB — CONVERTED CEMR LAB
Cholesterol: 255 mg/dL — ABNORMAL HIGH (ref 0–200)
Total CHOL/HDL Ratio: 6.1
VLDL: 17 mg/dL (ref 0–40)

## 2010-04-19 NOTE — Letter (Signed)
Summary: Probation Letter  [facility]  [address]   [place], [place] [address]   Phone: [phone]  Fax: [phone]    05/16/2009  LOLITA FAULDS [address] [place], [place]  [address]  Dear Ms. BYRD,  With the goal of better serving all our patients the [facility] is following each patient's missed appointments.  You have missed at least 3 appointments with our practice.If you cannot keep your appointment, we expect you to call at least 24 hours before your appointment time.  Missing appointments prevents other patients from seeing [place] and makes it difficult to provide you with the best possible medical care.      1.   If you miss one more appointment, we will only give you limited medical services. This means we will not call in medication refills, complete a form, or make a referral for you except when you are here for a scheduled office visit.    2.   If you miss 2 or more appointments in the next year, we will dismiss you from our practice.    Our office staff can be reached at [phone] Monday through Friday from 8:30 a.m.-5:00 p.m. and will be glad to schedule your appointment as necessary.    Thank you.   The [facility]  Appended Document: Probation Letter mailed certified  Appended Document: Probation Letter Letter returned unable to forward.

## 2010-04-19 NOTE — Progress Notes (Signed)
Summary: Rx Req  Phone Note Refill Request Call back at [phone] Message from:  Patient  Refills Requested: Medication #1:  ESTRACE 2 MG TABS 1 tab by mouth daily [facility] [address] [place].  Initial call taken by: Clydell Hakim,  Aug 12, 2009 2:42 PM  Follow-up for Phone Call        This was inititally provided by Dr. Okey Dupre at [facility].  Will defer to him for further refills.  I just provided enough the last time in order for her to be reevaluated by Dr. Okey Dupre Follow-up by: Marisue Ivan  MD,  Aug 13, 2009 8:52 AM  Additional Follow-up for Phone Call Additional follow up Details #1::        left message for pt to return call Additional Follow-up by: Gladstone Pih,  Aug 13, 2009 11:37 AM    Additional Follow-up for Phone Call Additional follow up Details #2::    pt stated that she now lives in [place] and they do not have a program like ours there that will see her.  I told her since it was friday and the holiday to leave a message on the nurse line to see if she could get an rx that way.  I also told her to try black cohosh or estrovent for the hot flashes  Follow-up by: Loralee Pacas CMA,  Aug 13, 2009 4:45 PM

## 2010-04-19 NOTE — Progress Notes (Signed)
Summary: Schedule beh med appt  Phone Note Call from Patient   Caller: Patient Call For: Spero Geralds, Psy.D. Summary of Call: Patient and I have exchanged VM messages two times.  Reached her this afternoon.  She was in [employer] and asked to call me back.  Did not hear back from her before I left for the day. Initial call taken by: Spero Geralds PsyD,  April 22, 2009 4:45 PM  Follow-up for Phone Call        Scheduled for Thurs. 2/17 at 2:00. Follow-up by: Spero Geralds PsyD,  April 23, 2009 3:44 PM

## 2010-05-09 ENCOUNTER — Encounter: Payer: Self-pay | Admitting: *Deleted

## 2010-06-23 LAB — POCT I-STAT, CHEM 8
BUN: 13 mg/dL (ref 6–23)
Creatinine, Ser: 1.1 mg/dL (ref 0.4–1.2)
Potassium: 3.9 mEq/L (ref 3.5–5.1)
Sodium: 139 mEq/L (ref 135–145)

## 2010-06-23 LAB — URINALYSIS, ROUTINE W REFLEX MICROSCOPIC
Bilirubin Urine: NEGATIVE
Ketones, ur: NEGATIVE mg/dL
Nitrite: NEGATIVE
Specific Gravity, Urine: 1.023 (ref 1.005–1.030)
Urobilinogen, UA: 0.2 mg/dL (ref 0.0–1.0)

## 2010-06-24 LAB — POCT URINALYSIS DIP (DEVICE)
Bilirubin Urine: NEGATIVE
Specific Gravity, Urine: 1.025 (ref 1.005–1.030)
pH: 5.5 (ref 5.0–8.0)

## 2010-06-24 LAB — WET PREP, GENITAL
WBC, Wet Prep HPF POC: NONE SEEN
Yeast Wet Prep HPF POC: NONE SEEN

## 2010-06-24 LAB — GC/CHLAMYDIA PROBE AMP, GENITAL: Chlamydia, DNA Probe: NEGATIVE

## 2010-06-25 LAB — URINE MICROSCOPIC-ADD ON

## 2010-06-25 LAB — URINALYSIS, ROUTINE W REFLEX MICROSCOPIC
Bilirubin Urine: NEGATIVE
Glucose, UA: NEGATIVE mg/dL
Hgb urine dipstick: NEGATIVE
Ketones, ur: NEGATIVE mg/dL
Nitrite: NEGATIVE
Protein, ur: NEGATIVE mg/dL
Specific Gravity, Urine: 1.024 (ref 1.005–1.030)
Urobilinogen, UA: 0.2 mg/dL (ref 0.0–1.0)
pH: 6 (ref 5.0–8.0)

## 2010-06-25 LAB — GC/CHLAMYDIA PROBE AMP, GENITAL: Chlamydia, DNA Probe: NEGATIVE

## 2010-06-25 LAB — URINE CULTURE

## 2010-06-25 LAB — WET PREP, GENITAL: Trich, Wet Prep: NONE SEEN

## 2010-06-26 LAB — POCT I-STAT, CHEM 8
BUN: 15 mg/dL (ref 6–23)
Calcium, Ion: 1.13 mmol/L (ref 1.12–1.32)
Chloride: 105 mEq/L (ref 96–112)
Potassium: 3.9 mEq/L (ref 3.5–5.1)

## 2010-07-04 LAB — POCT URINALYSIS DIP (DEVICE)
Bilirubin Urine: NEGATIVE
Ketones, ur: NEGATIVE mg/dL
Protein, ur: NEGATIVE mg/dL
Specific Gravity, Urine: 1.02 (ref 1.005–1.030)

## 2010-07-04 LAB — WET PREP, GENITAL
Trich, Wet Prep: NONE SEEN
Yeast Wet Prep HPF POC: NONE SEEN

## 2010-07-04 LAB — POCT PREGNANCY, URINE: Preg Test, Ur: NEGATIVE

## 2010-08-05 NOTE — Group Therapy Note (Signed)
NAME:  Victoria Ali, YANNI NO.:  [id number]   MEDICAL RECORD NO.:  [id number]          PATIENT TYPE:  WOC   LOCATION:  [facility]                   FACILITY:  [facility]   PHYSICIAN:  Ellis Parents, MD    DATE OF BIRTH:  [birth year]   DATE OF SERVICE:  02/16/2004                                    CLINIC NOTE   REASON FOR VISIT:  This 40 year old gravida 2, para 2, last menstrual period  February 02, 2004 comes in with a history of point tenderness in the lower  abdomen of approximately 1-1/2 years duration.  The patient had a  laparoscopy with sterilization approximately 2 years ago and approximately 6  months later she started developing pain over the lower incisional site.  She was seen in [facility] on February 02, 2004.  GC, Chlamydia studies were  negative.  CBC, urinalysis, wet prep were all normal.  Pelvic ultrasound was  normal except for a simple left ovarian cyst measuring 3.7 x 3 cm.   PHYSICAL EXAMINATION:  ABDOMEN:  Soft and nontender.  There is an  infraumbilical incision present and a suprapubic incision present measuring  approximately 1.5 cm.  The suprapubic incision is point sensitive.  There  are no palpable masses present.  There is minimal induration.  PELVIC:  External genitalia are normal.  The vagina is clean.  The cervix is  normal and well epithelialized.  The uterus is anterior, normal in size,  mobile, and nontender.  Both adnexa are soft without induration, masses, or  nodularity.   IMPRESSION:  It appears that the patient's pain is related to the  laparoscopy, possibly some minimal induration is causing this sensitivity  although there is certainly an anxiety factor present here.  I have  discussed my diagnosis with the patient and she seems to be comfortable with  this explanation.  The patient is to return on a p.r.n. basis.      SA/MEDQ  D:  02/16/2004  T:  02/16/2004  Job:  [id number]

## 2010-08-05 NOTE — Group Therapy Note (Signed)
NAME:  Victoria Ali, Victoria Ali NO.:  [id number]   MEDICAL RECORD NO.:  [id number]          PATIENT TYPE:  WOC   [facility]ATION[facility]                   FACILITY:  [facility]   PHYSICIAN:  Tinnie Gens, MD        DATE OF BIRTH:  [birth year]   DATE OF SERVICE:                                    CLINIC NOTE   CHIEF COMPLAINT:  Pain in the right ovary.   HISTORY OF PRESENT ILLNESS:  The patient is a 40 year old gravida 2, para 2,  who is status post tubal ligation, who has profound hirsutism and a strong  family history of this, who has recently been on Ovcon and __________  for  treatment of this.  Her hair continues to grow and does not get any better.  She complained today of several week history since her LMP, of June 12, 2005, of having a nagging sort of pain on the right ovary.  She has had pain  similar to this before.  It is usually related to an ovarian cyst.  She went  to urgent care.  They gave her some pain medicine which did not really help  the pain, she states it has gotten somewhat worse over the last several  weeks.  The patient is also complaining today of a yellow runny vaginal  discharge that has sort of a fishy odor.  She denies significant fever,  chills, nausea, or vomiting.  She has been using Monistat.  She is not  really sure if that has helped clear this up.  She has not been sexually  active since last year, but notes that she has had the discharge for quite  some time.   PHYSICAL EXAMINATION:  VITAL SIGNS:  She is afebrile, 98.5, pulse is 65,  blood pressure 101/71.  GENERAL:  She is a well-developed, well-nourished, black female in no acute  distress.  She is profoundly hirsute in most midline places.  GU:  Shows normal external female genitalia.  The vagina is pink and rugae.  There is a watery yellow-type discharge noted.  The cervix was somewhat  friable but there is no lesion.  The uterus is anteverted.  There is  cervical motion tenderness.  Her  uterus is diffusely tender.  The right  adnexa is diffusely tender.  Can not feel significant enlargement on that  side, however.   IMPRESSION:  1.  Pelvic pain, right sided.  2.  Vaginal discharge.   PLAN:  1.  GC and Chlamydia testing, external wet prep.  2.  Pelvic ultrasound to look at her ovaries.  3.  She will continue her Ovcon and the patient needs a followup Pap smear      in November 2007.           ______________________________  Tinnie Gens, MD     TP/MEDQ  D:  06/28/2005  T:  06/28/2005  Job:  [id number]

## 2010-08-05 NOTE — Group Therapy Note (Signed)
NAME:  TONNYA, GARBETT NO.:  [id number]   MEDICAL RECORD NO.:  [id number]          PATIENT TYPE:  WOC   [facility]ATION[facility]                   FACILITY:  [facility]   PHYSICIAN:  Argentina Donovan, MD        DATE OF BIRTH:  10-20-1970   DATE OF SERVICE:  07/26/2005                                    CLINIC NOTE   GYN CLINIC VISIT:  The patient is a 40 year old, gravida 2 para 2-0-0-2,  post tubal ligation who began today telling me that she has had eight years  of this problem with her pelvic pain and wants everything taken out.  We  will thus schedule her for total laparoscopic-assisted hysterectomy and  bilateral salpingo-oophorectomy for recurrent ovarian cysts and chronic  pelvic pain with the possibility for abdominal hysterectomy.  Past medical  history is noncontributory.  We have discussed the possible complications  that may occur especially those related to estrogen deprivation post  surgery.  She seems to understand this.  We will put her in for a  preoperative consultation prior to surgery.           ______________________________  Argentina Donovan, MD     PR/MEDQ  D:  07/26/2005  T:  07/27/2005  Job:  [id number]

## 2010-08-05 NOTE — Group Therapy Note (Signed)
NAME:  Victoria Ali, Victoria Ali NO.:  [id number]   MEDICAL RECORD NO.:  [id number]          PATIENT TYPE:  WOC   [facility]ATION[facility]                   [facility]AC[facility]   PHYSICIAN:  Dorthula Perfect, MD     DATE OF BIRTH:  1971-03-08   DATE OF SERVICE:  10/26/2005                                    CLINIC NOTE   This 40 year old black female gravida 2, para 2 returns today having had  vaginal hysterectomy with bilateral salpingo-oophorectomies done August 21, 2005.  Pathology revealed proliferative endometrium without hyperplasia.  Right and left ovaries contain hemorrhagic corpus luteal cysts.  She  apparently had had abnormal bleeding and pelvic pain.   She is taking Estradiol 2 mg a day, but is having symptomatic hot flashes  daily.  Thyroid tests done June 21 was within the range of normal.   She has been feeling bad for the past two weeks.  She has been having both  nausea, vomiting, and diarrhea.  She says she was seen in an urgent care  yesterday who did nothing for me and told her to talk to the doctors  today.   Abdomen is completely negative.  No tenderness.  Pelvic examination:  External genitalia and BUS glands are normal.  Vaginal vault is  epithelialized and the vaginal cuff is well healed.  There is no cuff  thickening or tenderness.  There are no pelvic masses noted.   DIAGNOSES:  1. Postoperative surgical examination.  2. Possible gastrointestinal viral illness.  3. Symptomatic hot flashes.   DISPOSITION:  1. The patient will continue to take Estradiol 2 mg a day.  A new      medication, Covaryx, which is a combination of __________ estrogen and      methyltestosterone either 0.625/1.25 or 1.25/2.5 is available      commercially.  Medical assistant here in the clinic will try to get      some samples and the patient will contact her next week.  I have      recommended that she take the samples for the next six to eight weeks      and if she notices  significant benefit then she will need a      prescription for that.  If she does not notice significant benefit then      she will need to stay on the Estradiol 2 mg.  2. I recommended that she return to the urgent medical care people to      manage this gastrointestinal problem.  She says that they will do      nothing.  I asked her to see them again and in the meantime she can be      on a liquid diet and get some Imodium which is over-the-counter.           ______________________________  Dorthula Perfect, MD    ER/MEDQ  D:  10/26/2005  T:  10/26/2005  Job:  [id number]

## 2010-08-05 NOTE — Group Therapy Note (Signed)
NAME:  Victoria Ali, ROBARTS NO.:  [id number]   MEDICAL RECORD NO.:  [id number]          PATIENT TYPE:  WOC   [facility]                   [facility]   PHYSICIAN:  Argentina Donovan, MD        DATE OF BIRTH:  06/19/1970   DATE OF SERVICE:  06/07/2004                                    CLINIC NOTE   REASON FOR VISIT:  The patient is a 40 year old black female with polycystic  ovarian syndrome and elevated free testosterone with abnormal hair growth,  acne, who was placed on Yasmin as well as spironolactone. She had to go into  the emergency room about a week ago with low abdominal pain which revealed  ovarian cysts and pruritic rash that may be a drug-related rash. 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 If the rash does not go away, I want her to see a  dermatologist. In addition, I have increased the strength of her birth  control pills to Ovcon-50 to see if we could make the functional cysts  resolve.   IMPRESSION:  Polycystic ovarian syndrome with medication-induced rash.      PR/MEDQ  D:  06/07/2004  T:  06/07/2004  Job:  [id number]

## 2010-08-05 NOTE — Op Note (Signed)
NAME:  Victoria Ali, Victoria Ali              ACCOUNT NO.:  [id number]   MEDICAL RECORD NO.:  [id number]          PATIENT TYPE:  INP   LOCATION:  9317                          [facility]   PHYSICIAN:  Phil D. Okey Dupre, M.D.     DATE OF BIRTH:  [birth year]   DATE OF PROCEDURE:  08/21/2005  DATE OF DISCHARGE:                                 OPERATIVE REPORT   PROCEDURE:  Total vaginal hysterectomy and bilateral salpingo-oophorectomy.   PREOPERATIVE DIAGNOSIS:  Chronic pelvic pain.   POSTOPERATIVE DIAGNOSIS:  Chronic pelvic pain--see pathology report.   SURGEON:  Shelbie Proctor. Shawnie Pons, M.D.   FIRST ASSISTANT:  Javier Glazier. Okey Dupre, M.D.   ANESTHESIA:  General.   ESTIMATED BLOOD LOSS:  100 mL.   POSTOPERATIVE CONDITION:  Satisfactory   SPECIMENS TO PATHOLOGY:  Uterus, tubes, and ovaries.   OPERATIVE FINDINGS:  Small uterus of normal size, shape, and consistency  with normal-appearing adnexa with the exception of 2 small areas of what  looked like endometriosis implants on the ovaries.  The ovaries, however,  were removed at the patient's request; as she says that she has had 12 bad  years of pain and wanted no further pelvic organs; in spite of being total  she has to stay on estrogen for many years.   PROCEDURE WENT AS FOLLOWS:  Under satisfactory general anesthesia with the  patient in the dorsolithotomy position, the perineum, vagina, and abdomen  were prepped and draped in the usual sterile manner.  A weighted speculum  was placed in the posterior fourchette of the vagina and the entire  circumference of the cervix was injected with 1% Xylocaine with 1:200,000  epinephrine, until blanching occurred; and the anterior lip of cervix was  grasped with double-tooth tenaculum.   A circular incision made around the entire circumference of the cervix,  approximately 1 cm, from the distal end and the tissue pushed away from the  distal end of the cervix by blunt dissection; the cul-de-sac was entered  just beneath the bladder by sharp dissection and the cul-de-sac of Douglas  entered by sharp dissection.  The uterine pedicles were then grasped  serially, divided, and ligated with #0 Vicryl suture on an atraumatic  needle.  Starting with the uterosacrals to include a portion of the cardinal  ligaments, the broad ligament pedicle to include the uterine vessels, the  uterosacral ligaments the suture was held long.  Free ties placed around  along the utero-ovarian pedicle; and then Heaney clamps placed medial to the  aforementioned tie; and the tissue medially divided, thus removing the  uterus en toto.  Holding the suture on the ovaries, a clamp was placed  across the infundibular pelvic ligament just outside of the ovary on both  sides.  The tissue medially divided, thus removing the ovaries and tubes;  and the lateral pedicle doubly ligated with #0 Vicryl suture on an  atraumatic needle.  The area was observed for bleeding.  These sutures were  cut short.   The vaginal cuff, posteriorly, was run from the area up near the ovary, from  one side  to the other, with a running lock suture that closed the peritoneum  posteriorly to the vaginal tissue.  Once this was done, a suture was placed  through the vaginal tissue on the anterior peritoneum, just below the  bladder, and this was run down closing the cuff from anterior-to-posterior  with a running lock suture.  The areas were observed, none was bleeding, a  vaginal pack was placed into the vagina.  A Foley catheter in the urinary  bladder was draining clear amber urine at the end of the procedure.  The  patient was transferred to the recovery room in satisfactory condition,  having tolerated the procedure well.           ______________________________  Javier Glazier. Okey Dupre, M.D.     PDR/MEDQ  D:  08/21/2005  T:  08/22/2005  Job:  [id number]

## 2010-08-05 NOTE — Group Therapy Note (Signed)
NAME:  Victoria Ali, VERSTEEG NO.:  [id number]   MEDICAL RECORD NO.:  [id number]          PATIENT TYPE:  WOC   [facility]ATION[facility]                   [facility]   PHYSICIAN:  Argentina Donovan, MD        DATE OF BIRTH:  Jun 08, 1970   DATE OF SERVICE:  01/24/2005                                    CLINIC NOTE   The patient is a 40 year old black female who went into [place] with  right flank pain.  Was referred here.  CBC and urinalysis were normal.  The  pain was of gradual onset and intermittent burning area and has improved  significantly since she was there.  Once she had the pain she decided she  would be better off with something on her stomach and got very nauseated  after eating.   PHYSICAL EXAMINATION:  ABDOMEN:  Soft, flat, and nontender.  No masses or  organomegaly.  GENITOURINARY:  External genitalia is normal.  BUS within normal limits.  Vagina is clean and well rugated.  The cervix is parous and clean and the  uterus is normal size, shape, and consistency with normal adnexa.  Pap smear  was taken.   IMPRESSION:  1.  Normal pelvic examination.  2.  Abdominal pain of unknown etiology, resolved.  3.  The patient also is tested and had elevated free testosterone when last      seen.  Has had a problem with abnormal hair growth and has been on Ovcon      50 to try and control that.  Has not been successful so Vaniqa was      ordered, a depilatory cream.           ______________________________  Argentina Donovan, MD     PR/MEDQ  D:  01/24/2005  T:  01/25/2005  Job:  [id number]

## 2010-08-05 NOTE — Discharge Summary (Signed)
NAME:  Victoria Ali, Victoria Ali              ACCOUNT NO.:  [id number]   MEDICAL RECORD NO.:  [id number]          PATIENT TYPE:  INP   LOCATION:  9317                          [facility]   PHYSICIAN:  Phil D. Okey Dupre, M.D.     DATE OF BIRTH:  [birth year]   DATE OF ADMISSION:  08/21/2005  DATE OF DISCHARGE:  08/23/2005                                 DISCHARGE SUMMARY   The patient is a 40 year old black female gravida 2, para 2-0-0-2, one-to-  one total vaginal hysterectomy, bilateral salpingo-oophorectomy, and chronic  pelvic pain and the oophorectomy at the patient's request, as she wanted no  further pelvic surgery.  This was in spite of much consultation as to her  need of estrogen and long-term, etc.  The surgery went well.  The patient is  recovering nicely the second day postop and will be discharged on Slow Fe,  has a hemoglobin of 9+, and Percocet for pain and Estrace for estrogen  supplementation 2 mg.  She will seen in the GYN office in 2 weeks, and we  have given her detailed instructions as to follow up, activity, and diet.   DISCHARGE DIAGNOSIS:  Satisfactorily recovering post total vaginal  hysterectomy, bilateral salpingo-oophorectomy for chronic pelvic pain.           ______________________________  Javier Glazier. Okey Dupre, M.D.     PDR/MEDQ  D:  08/23/2005  T:  08/23/2005  Job:  [id number]

## 2010-08-05 NOTE — Group Therapy Note (Signed)
NAME:  Victoria Ali, LUTON NO.:  [id number]   MEDICAL RECORD NO.:  [id number]          PATIENT TYPE:  WOC   LOCATION:  [facility]                   FACILITY:  [facility]   PHYSICIAN:  Argentina Donovan, MD        DATE OF BIRTH:  09-26-1970   DATE OF SERVICE:  04/14/2004                                    CLINIC NOTE   CHIEF COMPLAINT:  Increased facial hair.   SUBJECTIVE:  Victoria Ali is a 40 year old patient who presents for abnormal  hair growth for the past few years.  She states she has had this for about  eight years and occurs on her chin, bilateral cheeks, back, face, and some  upper arms.  She has had to pluck them at times and has become more  noticeable.  She does have irregular periods.  When she was on birth control  which consists of Depo and Yasmin in the past this has helped to regulate  her periods.  She has noticed some increasing acne as well.  There has been  no voice changes.  Of note, her sexual drive has increased recently as well.   OBJECTIVE:  VITAL SIGNS:  As noted in the chart.  GENERAL:  Pleasant, alert, in no acute distress.  HEENT:  Examination of her face does show some increased facial hair in the  distribution of her cheeks and some on her chin.  She has plucked all the  ones above her upper lip.  NECK:  Supple.  No lymphadenopathy.  No thyromegaly is palpable.  CARDIOVASCULAR:  Regular rate and rhythm.  LUNGS:  Clear to auscultation bilaterally.  BACK:  Some moderate amount of hair.  UPPER CHEST:  Some moderate amount of hair.   LABORATORY DATA:  LH, FSH, TSH, prolactin, free testosterone, DHEAS will be  obtained.   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  Will check laboratories as listed above.  Could begin  Yasmin therapy per patient's request for birth control.  Will add  spironolactone 50 mg p.o. b.i.d.  Will follow up in two months to see how  she is doing on this  therapy.      AK/MEDQ  D:  04/14/2004  T:  04/15/2004  Job:  [id number]

## 2010-12-16 LAB — POCT URINALYSIS DIP (DEVICE)
Ketones, ur: NEGATIVE
Nitrite: NEGATIVE
Operator id: 29721
Protein, ur: NEGATIVE
Urobilinogen, UA: 0.2
pH: 6

## 2010-12-19 LAB — RAPID STREP SCREEN (MED CTR MEBANE ONLY): Streptococcus, Group A Screen (Direct): NEGATIVE

## 2010-12-21 LAB — COMPREHENSIVE METABOLIC PANEL
ALT: 22
Albumin: 3.9
Alkaline Phosphatase: 64
Calcium: 9.5
GFR calc Af Amer: >60
Potassium: 3.8
Sodium: 141
Total Protein: 7.1

## 2010-12-21 LAB — URINALYSIS, ROUTINE W REFLEX MICROSCOPIC
Bilirubin Urine: NEGATIVE
Glucose, UA: NEGATIVE
Ketones, ur: NEGATIVE
Nitrite: NEGATIVE
Specific Gravity, Urine: 1.019
pH: 6.5

## 2010-12-21 LAB — DIFFERENTIAL
Basophils Relative: 0
Eosinophils Absolute: 0.2
Lymphs Abs: 2.7
Monocytes Absolute: 0.5
Monocytes Relative: 7
Neutrophils Relative %: 54

## 2010-12-21 LAB — POCT PREGNANCY, URINE: Preg Test, Ur: NEGATIVE

## 2010-12-21 LAB — CBC
MCHC: 34.3
Platelets: 256
RDW: 14.3

## 2010-12-30 LAB — POCT URINALYSIS DIP (DEVICE)
Glucose, UA: NEGATIVE
Nitrite: NEGATIVE
Operator id: 235561
Protein, ur: NEGATIVE
Specific Gravity, Urine: 1.025
Urobilinogen, UA: 0.2

## 2010-12-30 LAB — POCT PREGNANCY, URINE: Operator id: 2355612

## 2010-12-30 LAB — GC/CHLAMYDIA PROBE AMP, GENITAL
Chlamydia, DNA Probe: NEGATIVE
GC Probe Amp, Genital: NEGATIVE

## 2011-03-07 ENCOUNTER — Encounter: Payer: Self-pay | Admitting: Family Medicine

## 2011-04-10 ENCOUNTER — Encounter: Payer: Self-pay | Admitting: Family Medicine

## 2012-08-29 ENCOUNTER — Encounter (HOSPITAL_COMMUNITY): Payer: Self-pay | Admitting: Emergency Medicine

## 2012-08-29 ENCOUNTER — Emergency Department (HOSPITAL_COMMUNITY)
Admission: EM | Admit: 2012-08-29 | Discharge: 2012-08-30 | Disposition: A | Discharge status: Home / Self Care | Payer: Medicaid Other | Attending: Emergency Medicine | Admitting: Emergency Medicine

## 2012-08-29 ENCOUNTER — Emergency Department (HOSPITAL_COMMUNITY): Payer: Medicaid Other

## 2012-08-29 DIAGNOSIS — Z8589 Personal history of malignant neoplasm of other organs and systems: Secondary | ICD-10-CM | POA: Insufficient documentation

## 2012-08-29 DIAGNOSIS — N23 Unspecified renal colic: Secondary | ICD-10-CM

## 2012-08-29 DIAGNOSIS — F329 Major depressive disorder, single episode, unspecified: Secondary | ICD-10-CM | POA: Insufficient documentation

## 2012-08-29 DIAGNOSIS — Z8719 Personal history of other diseases of the digestive system: Secondary | ICD-10-CM | POA: Insufficient documentation

## 2012-08-29 DIAGNOSIS — F3289 Other specified depressive episodes: Secondary | ICD-10-CM | POA: Insufficient documentation

## 2012-08-29 DIAGNOSIS — R319 Hematuria, unspecified: Secondary | ICD-10-CM | POA: Insufficient documentation

## 2012-08-29 DIAGNOSIS — N201 Calculus of ureter: Secondary | ICD-10-CM | POA: Insufficient documentation

## 2012-08-29 DIAGNOSIS — Z79899 Other long term (current) drug therapy: Secondary | ICD-10-CM | POA: Insufficient documentation

## 2012-08-29 HISTORY — DX: Irritable bowel syndrome, unspecified: K58.9

## 2012-08-29 HISTORY — DX: Calculus of kidney: N20.0

## 2012-08-29 HISTORY — DX: Depression, unspecified: F32.A

## 2012-08-29 HISTORY — DX: Major depressive disorder, single episode, unspecified: F32.9

## 2012-08-29 HISTORY — DX: Non-Hodgkin lymphoma, unspecified, unspecified site: C85.90

## 2012-08-29 MED ORDER — ONDANSETRON 4 MG PO TBDP
8.0000 mg | ORAL_TABLET | Freq: Once | ORAL | Status: AC
  Administered 2012-08-29: 8 mg via ORAL
  Filled 2012-08-29: qty 2

## 2012-08-29 NOTE — ED Notes (Signed)
PT. REPORTS RIGHT FLANK PAIN WITH HEMATURIA ONSET TODAY , PT. STATED HISTORY OF KIDNEY STONES , SLIGHT NAUSEA.

## 2012-08-30 LAB — URINALYSIS, ROUTINE W REFLEX MICROSCOPIC
Bilirubin Urine: NEGATIVE
Glucose, UA: NEGATIVE mg/dL
Specific Gravity, Urine: 1.014 (ref 1.005–1.030)
Urobilinogen, UA: 0.2 mg/dL (ref 0.0–1.0)
pH: 7 (ref 5.0–8.0)

## 2012-08-30 LAB — COMPREHENSIVE METABOLIC PANEL
ALT: 55 U/L — ABNORMAL HIGH (ref 0–35)
Alkaline Phosphatase: 57 U/L (ref 39–117)
BUN: 9 mg/dL (ref 6–23)
CO2: 28 mEq/L (ref 19–32)
Calcium: 9.2 mg/dL (ref 8.4–10.5)
GFR calc Af Amer: 78 mL/min — ABNORMAL LOW (ref >90)
GFR calc non Af Amer: 68 mL/min — ABNORMAL LOW (ref >90)
Glucose, Bld: 174 mg/dL — ABNORMAL HIGH (ref 70–99)
Sodium: 139 mEq/L (ref 135–145)

## 2012-08-30 LAB — CBC WITH DIFFERENTIAL/PLATELET
Eosinophils Relative: 1 % (ref 0–5)
HCT: 34.4 % — ABNORMAL LOW (ref 36.0–46.0)
Hemoglobin: 11.3 g/dL — ABNORMAL LOW (ref 12.0–15.0)
Lymphocytes Relative: 18 % (ref 12–46)
Lymphs Abs: 2.6 10*3/uL (ref 0.7–4.0)
MCH: 27.2 pg (ref 26.0–34.0)
MCV: 82.9 fL (ref 78.0–100.0)
Monocytes Relative: 6 % (ref 3–12)
Platelets: 164 10*3/uL (ref 150–400)
RBC: 4.15 MIL/uL (ref 3.87–5.11)
WBC: 14.5 10*3/uL — ABNORMAL HIGH (ref 4.0–10.5)

## 2012-08-30 LAB — URINE MICROSCOPIC-ADD ON

## 2012-08-30 MED ORDER — ONDANSETRON HCL 4 MG/2ML IJ SOLN
4.0000 mg | Freq: Once | INTRAMUSCULAR | Status: AC
  Administered 2012-08-30: 4 mg via INTRAVENOUS
  Filled 2012-08-30: qty 2

## 2012-08-30 MED ORDER — SODIUM CHLORIDE 0.9 % IV SOLN
1000.0000 mL | Freq: Once | INTRAVENOUS | Status: AC
  Administered 2012-08-30: 1000 mL via INTRAVENOUS

## 2012-08-30 MED ORDER — HYDROMORPHONE HCL 2 MG PO TABS: 2.0000 mg | ORAL_TABLET | ORAL | Status: DC | PRN

## 2012-08-30 MED ORDER — HYDROMORPHONE HCL PF 1 MG/ML IJ SOLN
1.0000 mg | Freq: Once | INTRAMUSCULAR | Status: AC
  Administered 2012-08-30: 1 mg via INTRAVENOUS
  Filled 2012-08-30: qty 1

## 2012-08-30 MED ORDER — TAMSULOSIN HCL 0.4 MG PO CAPS: 0.4000 mg | ORAL_CAPSULE | Freq: Every day | ORAL | Status: DC

## 2012-08-30 MED ORDER — ONDANSETRON HCL 4 MG PO TABS: 4.0000 mg | ORAL_TABLET | Freq: Four times a day (QID) | ORAL | Status: DC | PRN

## 2012-08-30 MED ORDER — SODIUM CHLORIDE 0.9 % IV SOLN
1000.0000 mL | INTRAVENOUS | Status: DC
  Administered 2012-08-30: 1000 mL via INTRAVENOUS

## 2012-08-30 NOTE — ED Notes (Signed)
PT comfortable with d/c and f/u instructions. Prescriptions x3 

## 2012-08-30 NOTE — ED Notes (Signed)
Pt states unable to provide urine sample at this time.

## 2012-08-30 NOTE — ED Notes (Signed)
Pt unable to provide urine sample at this time 

## 2012-08-30 NOTE — ED Provider Notes (Signed)
History     CSN: [id number]  Arrival date & time 08/29/12  2145   First MD Initiated Contact with Patient 08/30/12 0009      Chief Complaint  Patient presents with  . Nephrolithiasis  . Hematuria    (Consider location/radiation/quality/duration/timing/severity/associated sxs/prior treatment) Patient is a 42 y.o. female presenting with hematuria. The history is provided by the patient.  Hematuria  She had onset this evening of severe pain in the right flank without radiation. This is been associated with nausea but no vomiting. She denies urinary urgency, frequency, tenesmus, and dysuria. She has noted hematuria. She has a history of kidney stones in the past. Current pain is severe and rated at 10/10. Nothing makes it better nothing makes it worse. She describes it as sharp and dull and throbbing. She has had chills without fever or sweats. She has not taken anything for pain.  Past Medical History  Diagnosis Date  . Renal stones   . Lymphoma   . Renal stones   . IBS (irritable bowel syndrome)   . Depression     Past Surgical History  Procedure Laterality Date  . Lithotripsy    . Abdominal hysterectomy    . Laparoscopy      No family history on file.  History  Substance Use Topics  . Smoking status: Never Smoker   . Smokeless tobacco: Not on file  . Alcohol Use: No    OB History   Grav Para Term Preterm Abortions TAB SAB Ect Mult Living                  Review of Systems  Genitourinary: Positive for hematuria.  All other systems reviewed and are negative.    Allergies  Review of patient's allergies indicates no known allergies.  Home Medications   Current Outpatient Rx  Name  Route  Sig  Dispense  Refill  . citalopram (CELEXA) 40 MG tablet   Oral   Take 40 mg by mouth daily.         . ergocalciferol (VITAMIN D2) 50000 UNITS capsule   Oral   Take 50,000 Units by mouth once a week. Mondays         . estradiol (VIVELLE-DOT) 0.1 MG/24HR    Transdermal   Place 1 patch onto the skin 2 (two) times a week. Every 4 days; per pt         . ferrous sulfate 325 (65 FE) MG tablet   Oral   Take 325 mg by mouth daily.             BP 98/58  Pulse 77  Temp(Src) 98 F (36.7 C) (Oral)  Resp 18  SpO2 100%  Physical Exam  Nursing note and vitals reviewed.  42 year old female, who appears uncomfortable, but his in no acute distress. Vital signs are normal. Oxygen saturation is 100%, which is normal. Head is normocephalic and atraumatic. PERRLA, EOMI. Oropharynx is clear. Neck is nontender and supple without adenopathy or JVD. Back is nontender in the midline. There is moderate right CVA tenderness. Lungs are clear without rales, wheezes, or rhonchi. Chest is nontender. Heart has regular rate and rhythm without murmur. Abdomen is soft, flat, with mild to moderate tenderness in the right mid and lower abdomen. There is no rebound or guarding. There are no masses or hepatosplenomegaly and peristalsis is hypoactive. Extremities have no cyanosis or edema, full range of motion is present. Skin is warm and dry without rash. Neurologic:  Mental status is normal, cranial nerves are intact, there are no motor or sensory deficits.  ED Course  Procedures (including critical care time)  Results for orders placed during the hospital encounter of 08/29/12  URINALYSIS, ROUTINE W REFLEX MICROSCOPIC      Result Value Range   Color, Urine YELLOW  YELLOW   APPearance CLEAR  CLEAR   Specific Gravity, Urine 1.014  1.005 - 1.030   pH 7.0  5.0 - 8.0   Glucose, UA NEGATIVE  NEGATIVE mg/dL   Hgb urine dipstick LARGE (*) NEGATIVE   Bilirubin Urine NEGATIVE  NEGATIVE   Ketones, ur NEGATIVE  NEGATIVE mg/dL   Protein, ur NEGATIVE  NEGATIVE mg/dL   Urobilinogen, UA 0.2  0.0 - 1.0 mg/dL   Nitrite NEGATIVE  NEGATIVE   Leukocytes, UA SMALL (*) NEGATIVE  CBC WITH DIFFERENTIAL      Result Value Range   WBC 14.5 (*) 4.0 - 10.5 K/uL   RBC 4.15  3.87 -  5.11 MIL/uL   Hemoglobin 11.3 (*) 12.0 - 15.0 g/dL   HCT 45.4 (*) 09.8 - 11.9 %   MCV 82.9  78.0 - 100.0 fL   MCH 27.2  26.0 - 34.0 pg   MCHC 32.8  30.0 - 36.0 g/dL   RDW 14.7  82.9 - 56.2 %   Platelets 164  150 - 400 K/uL   Neutrophils Relative % 75  43 - 77 %   Neutro Abs 10.8 (*) 1.7 - 7.7 K/uL   Lymphocytes Relative 18  12 - 46 %   Lymphs Abs 2.6  0.7 - 4.0 K/uL   Monocytes Relative 6  3 - 12 %   Monocytes Absolute 0.9  0.1 - 1.0 K/uL   Eosinophils Relative 1  0 - 5 %   Eosinophils Absolute 0.1  0.0 - 0.7 K/uL   Basophils Relative 0  0 - 1 %   Basophils Absolute 0.0  0.0 - 0.1 K/uL  COMPREHENSIVE METABOLIC PANEL      Result Value Range   Sodium 139  135 - 145 mEq/L   Potassium 3.2 (*) 3.5 - 5.1 mEq/L   Chloride 100  96 - 112 mEq/L   CO2 28  19 - 32 mEq/L   Glucose, Bld 174 (*) 70 - 99 mg/dL   BUN 9  6 - 23 mg/dL   Creatinine, Ser 1.30  0.50 - 1.10 mg/dL   Calcium 9.2  8.4 - 86.5 mg/dL   Total Protein 7.3  6.0 - 8.3 g/dL   Albumin 4.1  3.5 - 5.2 g/dL   AST 53 (*) 0 - 37 U/L   ALT 55 (*) 0 - 35 U/L   Alkaline Phosphatase 57  39 - 117 U/L   Total Bilirubin 0.2 (*) 0.3 - 1.2 mg/dL   GFR calc non Af Amer 68 (*) >90 mL/min   GFR calc Af Amer 78 (*) >90 mL/min  URINE MICROSCOPIC-ADD ON      Result Value Range   Squamous Epithelial / LPF FEW (*) RARE   WBC, UA 3-6  <3 WBC/hpf   RBC / HPF TOO NUMEROUS TO COUNT  <3 RBC/hpf   Bacteria, UA FEW (*) RARE   Ct Abdomen Pelvis Wo Contrast  08/29/2012   *RADIOLOGY REPORT*  Clinical Data: Right-sided flank pain and hematuria.  History of renal calculi.  CT ABDOMEN AND PELVIS WITHOUT CONTRAST  Technique:  Multidetector CT imaging of the abdomen and pelvis was performed following the standard protocol without  intravenous contrast.  Comparison: 12/24/2008  Findings: There is evidence of moderate right hydronephrosis and dilatation of the proximal right ureter.  A calculus is present in the mid ureter at roughly the level of the iliac crest  measuring 9 mm in estimated height and 3 mm in transverse diameter.  No additional calculi are identified on the right.  On the left side, there is a calculus identified at the ureteropelvic junction measuring approximately 7 mm in height and 4 mm in transverse diameter.  This is not causing current evidence of hydronephrosis.  The unenhanced appearance of the liver, gallbladder, pancreas, spleen, adrenal glands and bowel are unremarkable.  Bladder is decompressed.  No incidental masses or enlarged lymph nodes are seen.  No hernias are identified.  Bony structures are unremarkable.  IMPRESSION:  1.  Moderate right hydronephrosis with a 3 x 9 mm calculus located in the mid ureter at roughly the level of the iliac crest. 2.  Nonobstructing calculus at the left ureteropelvic junction measuring approximately 4 x 7 mm.   Original Report Authenticated By: Irish Lack, M.D.      1. Ureterolithiasis   2. Ureteral colic       MDM  Pain consistent with ureterolithiasis. CT is ordered and obtained confirming 9 mm x 3 mm calculus in the right mid ureter. Old records are reviewed and she was seen for a left-sided kidney stone in 2010. That's done with a 6 mm. Because of size of the stone, it is likely that she will need some kind of urologic procedure to help the stone pass. She will be given IV fluids, IV hydromorphone, IV ondansetron. Ketorolac will be withheld in case urologist types for lithotripsy. She will be referred to urology once pain is adequately controlled.  She required 2 doses of hydromorphone to get adequate pain relief. She is discharged with prescriptions for hydromorphone, ondansetron, and tamsulosin.      Dione Booze, MD 08/30/12 [phone]

## 2012-08-31 LAB — URINE CULTURE: Colony Count: 35000

## 2013-03-11 ENCOUNTER — Encounter (HOSPITAL_COMMUNITY): Payer: Self-pay | Admitting: Emergency Medicine

## 2013-03-11 ENCOUNTER — Emergency Department (HOSPITAL_COMMUNITY)
Admission: EM | Admit: 2013-03-11 | Discharge: 2013-03-12 | Disposition: A | Discharge status: Home / Self Care | Payer: Medicaid Other | Attending: Emergency Medicine | Admitting: Emergency Medicine

## 2013-03-11 DIAGNOSIS — Z79899 Other long term (current) drug therapy: Secondary | ICD-10-CM | POA: Insufficient documentation

## 2013-03-11 DIAGNOSIS — F3289 Other specified depressive episodes: Secondary | ICD-10-CM | POA: Insufficient documentation

## 2013-03-11 DIAGNOSIS — Z8719 Personal history of other diseases of the digestive system: Secondary | ICD-10-CM | POA: Insufficient documentation

## 2013-03-11 DIAGNOSIS — IMO0002 Reserved for concepts with insufficient information to code with codable children: Secondary | ICD-10-CM | POA: Insufficient documentation

## 2013-03-11 DIAGNOSIS — N760 Acute vaginitis: Secondary | ICD-10-CM | POA: Insufficient documentation

## 2013-03-11 DIAGNOSIS — Z87442 Personal history of urinary calculi: Secondary | ICD-10-CM | POA: Insufficient documentation

## 2013-03-11 DIAGNOSIS — Z792 Long term (current) use of antibiotics: Secondary | ICD-10-CM | POA: Insufficient documentation

## 2013-03-11 DIAGNOSIS — B9689 Other specified bacterial agents as the cause of diseases classified elsewhere: Secondary | ICD-10-CM | POA: Insufficient documentation

## 2013-03-11 DIAGNOSIS — Z87898 Personal history of other specified conditions: Secondary | ICD-10-CM | POA: Insufficient documentation

## 2013-03-11 DIAGNOSIS — Z7982 Long term (current) use of aspirin: Secondary | ICD-10-CM | POA: Insufficient documentation

## 2013-03-11 DIAGNOSIS — R42 Dizziness and giddiness: Secondary | ICD-10-CM | POA: Insufficient documentation

## 2013-03-11 DIAGNOSIS — A499 Bacterial infection, unspecified: Secondary | ICD-10-CM | POA: Insufficient documentation

## 2013-03-11 DIAGNOSIS — F329 Major depressive disorder, single episode, unspecified: Secondary | ICD-10-CM | POA: Insufficient documentation

## 2013-03-11 NOTE — ED Notes (Signed)
Patient is alert and oriented x3.  She is complaining of lower abdominal pain that she also complains of dizziness. She states that she has kidney surgery in July and states that she has had problems since.  Currently she rates her Pain 7 of 10 with nausea.

## 2013-03-12 LAB — POCT I-STAT, CHEM 8
Creatinine, Ser: 0.9 mg/dL (ref 0.50–1.10)
HCT: 41 % (ref 36.0–46.0)
Hemoglobin: 13.9 g/dL (ref 12.0–15.0)
Potassium: 3.7 mEq/L (ref 3.5–5.1)
Sodium: 145 mEq/L (ref 135–145)
TCO2: 28 mmol/L (ref 0–100)

## 2013-03-12 LAB — GC/CHLAMYDIA PROBE AMP
CT Probe RNA: NEGATIVE
GC Probe RNA: NEGATIVE

## 2013-03-12 LAB — URINALYSIS, ROUTINE W REFLEX MICROSCOPIC
Glucose, UA: NEGATIVE mg/dL
Hgb urine dipstick: NEGATIVE
Ketones, ur: NEGATIVE mg/dL
Protein, ur: NEGATIVE mg/dL

## 2013-03-12 LAB — CBC WITH DIFFERENTIAL/PLATELET
Basophils Absolute: 0 10*3/uL (ref 0.0–0.1)
Eosinophils Absolute: 0.1 10*3/uL (ref 0.0–0.7)
Lymphs Abs: 3.2 10*3/uL (ref 0.7–4.0)
MCH: 27.3 pg (ref 26.0–34.0)
Neutrophils Relative %: 40 % — ABNORMAL LOW (ref 43–77)
Platelets: 198 10*3/uL (ref 150–400)
RBC: 4.39 MIL/uL (ref 3.87–5.11)
RDW: 14.1 % (ref 11.5–15.5)
WBC: 6.1 10*3/uL (ref 4.0–10.5)

## 2013-03-12 LAB — WET PREP, GENITAL: Yeast Wet Prep HPF POC: NONE SEEN

## 2013-03-12 MED ORDER — METRONIDAZOLE 500 MG PO TABS
500.0000 mg | ORAL_TABLET | Freq: Once | ORAL | Status: AC
  Administered 2013-03-12: 500 mg via ORAL
  Filled 2013-03-12: qty 1

## 2013-03-12 MED ORDER — METRONIDAZOLE 500 MG PO TABS: 500.0000 mg | ORAL_TABLET | Freq: Two times a day (BID) | ORAL | Status: DC

## 2013-03-12 NOTE — ED Provider Notes (Signed)
CSN: [id number]     Arrival date & time 03/11/13  2254 History   First MD Initiated Contact with Patient 03/12/13 0051     Chief Complaint  Patient presents with  . Abdominal Pain  . Dizziness   (Consider location/radiation/quality/duration/timing/severity/associated sxs/prior Treatment) Patient is a 42 y.o. female presenting with abdominal pain and dizziness. The history is provided by the patient.  Abdominal Pain Pain location:  Suprapubic Pain quality: aching   Pain radiates to:  Does not radiate Pain severity:  Mild Onset quality:  Gradual Duration:  3 days Timing:  Constant Progression:  Worsening Chronicity:  Recurrent Relieved by:  None tried Associated symptoms: dysuria   Associated symptoms: no fever, no nausea and no vomiting   Dizziness Associated symptoms: no nausea and no vomiting     Past Medical History  Diagnosis Date  . Renal stones   . Lymphoma   . Renal stones   . IBS (irritable bowel syndrome)   . Depression    Past Surgical History  Procedure Laterality Date  . Lithotripsy    . Abdominal hysterectomy    . Laparoscopy     History reviewed. No pertinent family history. History  Substance Use Topics  . Smoking status: Never Smoker   . Smokeless tobacco: Not on file  . Alcohol Use: No   OB History   Grav Para Term Preterm Abortions TAB SAB Ect Mult Living                 Review of Systems  Constitutional: Negative for fever.  Gastrointestinal: Positive for abdominal pain. Negative for nausea and vomiting.  Genitourinary: Positive for dysuria and dyspareunia. Negative for frequency.  Skin: Negative for rash and wound.  Neurological: Positive for dizziness.  All other systems reviewed and are negative.    Allergies  Review of patient's allergies indicates no known allergies.  Home Medications   Current Outpatient Rx  Name  Route  Sig  Dispense  Refill  . aspirin 325 MG tablet   Oral   Take 325 mg by mouth daily.         .  Black Cohosh 20 MG TABS   Oral   Take 20 mg by mouth daily.         . calcium carbonate (OS-CAL) 600 MG TABS tablet   Oral   Take 600 mg by mouth 2 (two) times daily with a meal.         . citalopram (CELEXA) 40 MG tablet   Oral   Take 40 mg by mouth daily.         Marland Kitchen docusate sodium (COLACE) 100 MG capsule   Oral   Take 100 mg by mouth daily as needed for mild constipation.         . ergocalciferol (VITAMIN D2) 50000 UNITS capsule   Oral   Take 50,000 Units by mouth once a week. Mondays         . estradiol (VIVELLE-DOT) 0.1 MG/24HR   Transdermal   Place 1 patch onto the skin 2 (two) times a week. Every 4 days; per pt         . ferrous sulfate 325 (65 FE) MG tablet   Oral   Take 325 mg by mouth daily.           Marland Kitchen ibuprofen (ADVIL,MOTRIN) 200 MG tablet   Oral   Take 200 mg by mouth every 6 (six) hours as needed.         Marland Kitchen  metroNIDAZOLE (FLAGYL) 500 MG tablet   Oral   Take 1 tablet (500 mg total) by mouth 2 (two) times daily.   13 tablet   0    BP 109/91  Pulse 65  Temp(Src) 97.8 F (36.6 C) (Oral)  Resp 18  Ht 5' 6.5" (1.689 m)  Wt 166 lb 4 oz (75.411 kg)  BMI 26.43 kg/m2  SpO2 99% Physical Exam  Constitutional: She appears well-developed.  HENT:  Head: Normocephalic.  Eyes: Pupils are equal, round, and reactive to light.  Neck: Normal range of motion.  Cardiovascular: Normal rate and regular rhythm.   Pulmonary/Chest: Effort normal and breath sounds normal.  Abdominal: Soft. Bowel sounds are normal.  Genitourinary: Uterus normal. Rectal exam shows no tenderness. Guaiac negative stool. There is no tenderness, lesion or injury on the right labia. There is no tenderness, lesion or injury on the left labia. Uterus is not enlarged. Cervix exhibits no motion tenderness, no discharge and no friability. Right adnexum displays no mass, no tenderness and no fullness. Left adnexum displays no mass, no tenderness and no fullness. There is tenderness around  the vagina. No erythema around the vagina. No vaginal discharge found.  Tender vaginal walls.  No cervical motion tenderness.  A new adnexal fullness  Musculoskeletal: Normal range of motion.  Lymphadenopathy:       Right: No inguinal adenopathy present.       Left: No inguinal adenopathy present.  Skin: Skin is warm.    ED Course  Procedures (including critical care time) Labs Review Labs Reviewed  WET PREP, GENITAL - Abnormal; Notable for the following:    Clue Cells Wet Prep HPF POC FEW (*)    WBC, Wet Prep HPF POC FEW (*)    All other components within normal limits  CBC WITH DIFFERENTIAL - Abnormal; Notable for the following:    Neutrophils Relative % 40 (*)    Lymphocytes Relative 52 (*)    All other components within normal limits  POCT I-STAT, CHEM 8 - Abnormal; Notable for the following:    Glucose, Bld 106 (*)    Calcium, Ion 1.29 (*)    All other components within normal limits  URINE CULTURE  GC/CHLAMYDIA PROBE AMP  URINALYSIS, ROUTINE W REFLEX MICROSCOPIC   Imaging Review No results found.  EKG Interpretation   None       MDM   1. Bacterial vaginitis        Arman Filter, NP 03/12/13 7829  Arman Filter, NP 03/12/13 0510

## 2013-03-12 NOTE — ED Notes (Signed)
Pt c/o n/v, chills and fever for couples days.  Pt reports "feels like another bladder infections because i am itching all over like the last time i had bladder infection."  Pt reports difficulty with bladder since kidny stones sx in July.

## 2013-03-12 NOTE — ED Notes (Signed)
Pt verbalizes understyanding

## 2013-03-13 LAB — URINE CULTURE

## 2013-03-14 NOTE — ED Provider Notes (Signed)
Medical screening examination/treatment/procedure(s) were performed by non-physician practitioner and as supervising physician I was immediately available for consultation/collaboration.  EKG Interpretation   None         Carleigh Buccieri M Paiton B[place], MD 03/14/13 1150 

## 2014-05-14 ENCOUNTER — Encounter (HOSPITAL_COMMUNITY): Payer: Self-pay | Admitting: *Deleted

## 2014-05-14 ENCOUNTER — Emergency Department (INDEPENDENT_AMBULATORY_CARE_PROVIDER_SITE_OTHER)
Admission: EM | Admit: 2014-05-14 | Discharge: 2014-05-14 | Disposition: A | Discharge status: Home / Self Care | Payer: Medicaid Other | Source: Home / Self Care | Attending: Family Medicine | Admitting: Family Medicine

## 2014-05-14 DIAGNOSIS — R32 Unspecified urinary incontinence: Secondary | ICD-10-CM

## 2014-05-14 LAB — POCT URINALYSIS DIP (DEVICE)
BILIRUBIN URINE: NEGATIVE
GLUCOSE, UA: NEGATIVE mg/dL
HGB URINE DIPSTICK: NEGATIVE
KETONES UR: NEGATIVE mg/dL
Leukocytes, UA: NEGATIVE
NITRITE: NEGATIVE
Protein, ur: NEGATIVE mg/dL
Specific Gravity, Urine: 1.025 (ref 1.005–1.030)
Urobilinogen, UA: 0.2 mg/dL (ref 0.0–1.0)
pH: 6.5 (ref 5.0–8.0)

## 2014-05-14 MED ORDER — OXYBUTYNIN CHLORIDE 5 MG PO TABS
5.0000 mg | ORAL_TABLET | Freq: Three times a day (TID) | ORAL | Status: DC
Start: 2014-05-14 — End: 2014-07-02

## 2014-05-14 NOTE — Discharge Instructions (Signed)
Urinary Incontinence Urinary incontinence is the involuntary loss of urine from your bladder. CAUSES  There are many causes of urinary incontinence. They include:  Medicines.  Infections.  Prostatic enlargement, leading to overflow of urine from your bladder.  Surgery.  Neurological diseases.  Emotional factors. SIGNS AND SYMPTOMS Urinary Incontinence can be divided into four types:  Urge incontinence. Urge incontinence is the involuntary loss of urine before you have the opportunity to go to the bathroom. There is a sudden urge to void but not enough time to reach a bathroom.  Stress incontinence. Stress incontinence is the sudden loss of urine with any activity that forces urine to pass. It is commonly caused by anatomical changes to the pelvis and sphincter areas of your body.  Overflow incontinence. Overflow incontinence is the loss of urine from an obstructed opening to your bladder. This results in a backup of urine and a resultant buildup of pressure within the bladder. When the pressure within the bladder exceeds the closing pressure of the sphincter, the urine overflows, which causes incontinence, similar to water overflowing a dam.  Total incontinence. Total incontinence is the loss of urine as a result of the inability to store urine within your bladder. DIAGNOSIS  Evaluating the cause of incontinence may require:  A thorough and complete medical and obstetric history.  A complete physical exam.  Laboratory tests such as a urine culture and sensitivities. When additional tests are indicated, they can include:  An ultrasound exam.  Kidney and bladder X-rays.  Cystoscopy. This is an exam of the bladder using a narrow scope.  Urodynamic testing to test the nerve function to the bladder and sphincter areas. TREATMENT  Treatment for urinary incontinence depends on the cause:  For urge incontinence caused by a bacterial infection, antibiotics will be prescribed. If  the urge incontinence is related to medicines you take, your health care provider may have you change the medicine.  For stress incontinence, surgery to re-establish anatomical support to the bladder or sphincter, or both, will often correct the condition.  For overflow incontinence caused by an enlarged prostate, an operation to open the channel through the enlarged prostate will allow the flow of urine out of the bladder. In women with fibroids, a hysterectomy may be recommended.  For total incontinence, surgery on your urinary sphincter may help. An artificial urinary sphincter (an inflatable cuff placed around the urethra) may be required. In women who have developed a hole-like passage between their bladder and vagina (vesicovaginal fistula), surgery to close the fistula often is required. HOME CARE INSTRUCTIONS  Normal daily hygiene and the use of pads or adult diapers that are changed regularly will help prevent odors and skin damage.  Avoid caffeine. It can overstimulate your bladder.  Use the bathroom regularly. Try about every 2-3 hours to go to the bathroom, even if you do not feel the need to do so. Take time to empty your bladder completely. After urinating, wait a minute. Then try to urinate again.  For causes involving nerve dysfunction, keep a log of the medicines you take and a journal of the times you go to the bathroom. SEEK MEDICAL CARE IF:  You experience worsening of pain instead of improvement in pain after your procedure.  Your incontinence becomes worse instead of better. SEE IMMEDIATE MEDICAL CARE IF:  You experience fever or shaking chills.  You are unable to pass your urine.  You have redness spreading into your groin or down into your thighs. MAKE SURE  YOU:   Understand these instructions.   Will watch your condition.  Will get help right away if you are not doing well or get worse. Document Released: 04/13/2004 Document Revised: 12/25/2012 Document  Reviewed: 08/13/2012 [facility] Patient Information 2015 [place], [place]. This information is not intended to replace advice given to you by your health care provider. Make sure you discuss any questions you have with your health care provider.  Urodynamic Testing If you have a problem with urine leakage, urodynamic testing may be useful. This non-invasive test will be helpful to find out the cause of the problem. Problems in the urinary system can be caused by aging, illness, or injury. The muscles in your ureters, bladder, and urethra can become weaker with age. You may have more urinary infections and bladder stones because of the weakened bladder muscles. The muscles may not empty your bladder completely. Also, weakening of the urethral sphincter and the muscles of the pelvic floor can cause stress urinary continence. This is because the sphincter cannot remain tight enough to hold urine in the bladder or does not have enough support from the pelvic muscles. Urodynamics is the study of how the body stores and releases urine. These tests help your caregiver see how well your bladder and sphincter muscles work. The tests can help explain symptoms such as:  Inability to control your urine (incontinence).  Sudden, strong urges to urinate.  Painful urination.  Recurrent urinary tract infections.  Frequent urination.  Problems starting a urine stream.  Problems emptying your bladder completely. PREPARATION FOR TEST If your caregiver recommends bladder testing, usually no special preparations are needed. Make sure you understand any instructions you receive. Depending on the test, you may be asked to come with a full bladder or an empty one. Also, ask whether you should change your diet or skip your regular medicines and for how long. TAKING THE TEST Any procedure designed to provide information about a bladder problem can be called a urodynamic test. Your caregiver will want to know  whether:  You have difficulty starting a urine stream.  How hard you have to strain to maintain it.  The stream is interrupted.  Any urine is left in your bladder when you are done (post void residual). Urodynamic tests can range from simple observation to precise measurement using instruments. TESTING METHODS The type of test you take depends on your problem. The different tests are described below.  The use of imaging equipment. This equipment films urination.  Urinating behind a curtain while a doctor or nurse listens.  If leakage is the problem, a pad test is a simple way to measure how much urine seeps out. You will be given a number of absorbent pads and plastic bags. You will be told to wear the pad for 1 or 2 hours and then seal it in a bag. Your caregiver will then weigh the bags to see how much urine has been caught in the pad. A simpler, but less precise method is to change pads as often as you need to and keep track of how many pads you use in a day.  A physical exam will also be performed to rule out other causes of urinary problems. Other causes could include weakening pelvic muscles (pelvic prolapse) or prostate enlargement.  Pressure Flow Study-you will empty your bladder so a catheter can measure the pressures required to urinate. This study helps to identify causes of bladder outlet obstruction that men may experience with prostate enlargement. Bladder  outlet obstruction is less common in women but can occur with a fallen bladder or rarely after a surgical procedure for urinary incontinence.  Electromyography (Measurement of Nerve Impulses)-If your caregiver thinks that your urinary problem is related to nerve damage, you may be given an electromyography. This test measures the muscle activity in the urethral sphincter. It uses sensors placed on the skin near the urethra and rectum. Muscle activity is recorded on a machine. The impulse patterns show if the messages sent to  the bladder and urethra are coordinated correctly.  Video Urodynamics-can be performed with or without equipment to take pictures of the bladder during filling and emptying. The imaging equipment may use X-rays or sound waves. If X-ray equipment is used, the liquid used to fill the bladder may be a contrast medium that will show up on the X-ray. The pictures and videos show the size and shape of the urinary tract and help your caregiver understand your problem. AFTER THE TEST You may have mild discomfort for a few hours after these tests. Drinking two, 8-ounce glasses of water, each hour for 2 hours should help. Ask your caregiver if you can take a warm bath. If not, you may be able to hold a warm, damp washcloth over the urethral opening. This may relieve discomfort. You may be given an antibiotic to prevent an infection. Call your caregiver if you have signs of infection. These signs include pain, chills, or fever. OBTAINING THE TEST RESULTS Results for simple tests can be discussed with your caregiver immediately after the test. Results of other tests may take a few days. You will have time to ask questions about the results and possible treatments for your problem. It is your responsibility to obtain your test results. Ask the lab or department performing the test when and how you will get your results. FOR MORE INFORMATION  American Urological Association: www.auanet.[employer] for Urologic Disease: www.afud.org Interstitial Cystitis Association of America: [place].it National Kidney and Urologic Diseases Information Clearinghouse: nkudic@info .AmenCredit.is Document Released: 01/01/2007 Document Revised: 05/29/2011 Document Reviewed: 06/16/2013 [employer] Patient Information 2015 [place], [place]. This information is not intended to replace advice given to you by your health care provider. Make sure you discuss any questions you have with your health care provider.

## 2014-05-14 NOTE — ED Notes (Signed)
Pt  Has had problems  With  Her kidneys  And  Bladder  She  Reports  Incontinence           For  About 2  Weeks     She reports  As  Well  Pain in  bothe  Sides  Of  Her  Back       She  denys  Any  Burning  Or  Vomiting

## 2014-05-14 NOTE — ED Provider Notes (Signed)
CSN: [id number]     Arrival date & time 05/14/14  1431 History   First MD Initiated Contact with Patient 05/14/14 1543     Chief Complaint  Patient presents with  . Urinary Incontinence   (Consider location/radiation/quality/duration/timing/severity/associated sxs/prior Treatment) HPI Comments: 44 year old female who had seen a urologist in the past and had "surgery". She thinks this may have been lithotripsy but still is unsure as to whether she actually had an incision cut into her side or back for kidneys. After which she had her bladder stretched by the urologist. Almost immediately after that she is continues to have urinary incontinence and loss of bladder control. She has a constant urinary leak nearly 24 hours a day. Denies urgency or feeling of retention. This is been occurring for almost 2 years. He has been getting worse in the past 2 weeks. She states her urologist is no longer practicing and was referred to a urologist and [facility] but for uncertain reasons she has not made an appointment. This may be due to lack of primary care provider. Denies dysuria.   Past Medical History  Diagnosis Date  . Renal stones   . Lymphoma   . Renal stones   . IBS (irritable bowel syndrome)   . Depression    Past Surgical History  Procedure Laterality Date  . Lithotripsy    . Abdominal hysterectomy    . Laparoscopy     History reviewed. No pertinent family history. History  Substance Use Topics  . Smoking status: Never Smoker   . Smokeless tobacco: Not on file  . Alcohol Use: No   OB History    No data available     Review of Systems  Constitutional: Negative for fever and activity change.  Respiratory: Negative for cough and shortness of breath.   Cardiovascular: Negative for chest pain.  Gastrointestinal: Negative.   Genitourinary: Negative for dysuria.  Skin: Negative.     Allergies  Review of patient's allergies indicates no known allergies.  Home Medications    Prior to Admission medications   Medication Sig Start Date End Date Taking? Authorizing Provider  aspirin 325 MG tablet Take 325 mg by mouth daily.    Historical Provider, MD  Black Cohosh 20 MG TABS Take 20 mg by mouth daily.    Historical Provider, MD  calcium carbonate (OS-CAL) 600 MG TABS tablet Take 600 mg by mouth 2 (two) times daily with a meal.    Historical Provider, MD  citalopram (CELEXA) 40 MG tablet Take 40 mg by mouth daily.    Historical Provider, MD  docusate sodium (COLACE) 100 MG capsule Take 100 mg by mouth daily as needed for mild constipation.    Historical Provider, MD  ergocalciferol (VITAMIN D2) 50000 UNITS capsule Take 50,000 Units by mouth once a week. Mondays    Historical Provider, MD  estradiol (VIVELLE-DOT) 0.1 MG/24HR Place 1 patch onto the skin 2 (two) times a week. Every 4 days; per pt    Historical Provider, MD  ferrous sulfate 325 (65 FE) MG tablet Take 325 mg by mouth daily.      Historical Provider, MD  ibuprofen (ADVIL,MOTRIN) 200 MG tablet Take 200 mg by mouth every 6 (six) hours as needed.    Historical Provider, MD  metroNIDAZOLE (FLAGYL) 500 MG tablet Take 1 tablet (500 mg total) by mouth 2 (two) times daily. 03/12/13   Garald Balding, NP  oxybutynin (DITROPAN) 5 MG tablet Take 1 tablet (5 mg total) by mouth 3 (  three) times daily. 05/14/14   Janne Napoleon, NP   BP 115/80 mmHg  Pulse 82  Temp(Src) 98.2 F (36.8 C) (Oral)  Resp 16  SpO2 98% Physical Exam  Constitutional: She is oriented to person, place, and time. She appears well-developed and well-nourished. No distress.  Pulmonary/Chest: Effort normal.  Abdominal: Soft. She exhibits no distension and no mass. There is tenderness. There is no rebound.  Moderate tenderness over the suprapubic area. No palpable bladder distention. No abdominal tenderness.  Genitourinary:  Normal external female genitalia. There is no evidence of cervical/uterine prolapse. There is no evidence of cystocele. The  posterior portion of the bladder is even with the vaginal wall however does moderately to severely tender. No erythema. No evidence of bleeding. Vagina easily excepts the speculum without obstruction/restriction.  Neurological: She is alert and oriented to person, place, and time.  Skin: Skin is warm and dry.  Psychiatric: She has a normal mood and affect.    ED Course  Procedures (including critical care time) Labs Review Labs Reviewed  POCT URINALYSIS DIP (DEVICE)   Results for orders placed or performed during the hospital encounter of 05/14/14  POCT urinalysis dip (device)  Result Value Ref Range   Glucose, UA NEGATIVE NEGATIVE mg/dL   Bilirubin Urine NEGATIVE NEGATIVE   Ketones, ur NEGATIVE NEGATIVE mg/dL   Specific Gravity, Urine 1.025 1.005 - 1.030   Hgb urine dipstick NEGATIVE NEGATIVE   pH 6.5 5.0 - 8.0   Protein, ur NEGATIVE NEGATIVE mg/dL   Urobilinogen, UA 0.2 0.0 - 1.0 mg/dL   Nitrite NEGATIVE NEGATIVE   Leukocytes, UA NEGATIVE NEGATIVE    Imaging Review No results found.   MDM   1. Urinary incontinence in female    Strongly suspect that patient's incontinence is secondary condition related to her previous urinary procedures of urethral and bladder stretching. We will attempt to assist with due to pain and 5 mg immediate release 3 times a day when necessary. Discussed side effects. Follow-up with urologist as listed on page one.   Janne Napoleon, NP 05/14/14 [phone]

## 2014-06-04 ENCOUNTER — Encounter: Payer: Self-pay | Admitting: *Deleted

## 2014-06-04 ENCOUNTER — Emergency Department (INDEPENDENT_AMBULATORY_CARE_PROVIDER_SITE_OTHER)
Admission: EM | Admit: 2014-06-04 | Discharge: 2014-06-04 | Disposition: A | Discharge status: Home / Self Care | Payer: Medicaid Other | Source: Home / Self Care | Attending: Emergency Medicine | Admitting: Emergency Medicine

## 2014-06-04 DIAGNOSIS — J301 Allergic rhinitis due to pollen: Secondary | ICD-10-CM

## 2014-06-04 MED ORDER — CETIRIZINE HCL 10 MG PO TABS: 10.0000 mg | ORAL_TABLET | Freq: Every day | ORAL | Status: DC

## 2014-06-04 NOTE — ED Notes (Signed)
Victoria Ali c/o sneezing, sore throat, hoarseness without fever x 2 weeks. Taken Benadryl otc.

## 2014-06-04 NOTE — ED Provider Notes (Signed)
CSN: [id number]     Arrival date & time 06/04/14  1452 History   First MD Initiated Contact with Patient 06/04/14 1501     Chief Complaint  Patient presents with  . Hoarse  . Sore Throat   (Consider location/radiation/quality/duration/timing/severity/associated sxs/prior Treatment) HPI URI HISTORY  Victoria Ali is a 44 y.o. female who complains of some vague symptoms for about 10 days. Complains of sneezing, mild sore throat and hoarseness and clear postnasal drainage. Tried Benadryl OTC and that helped a little bit.  No chills/sweats No  Fever  Mild Nasal congestion No Discolored Post-nasal drainage No sinus pain/pressure Minimal scratchy throat  No  cough No wheezing No chest congestion No hemoptysis No shortness of breath No pleuritic pain  Mild itchy/red eyes, without discharge No earache  No nausea No vomiting No abdominal pain No diarrhea  No skin rashes +  Fatigue No myalgias No headache   Past Medical History  Diagnosis Date  . Renal stones   . Lymphoma   . Renal stones   . IBS (irritable bowel syndrome)   . Depression    Past Surgical History  Procedure Laterality Date  . Lithotripsy    . Abdominal hysterectomy    . Laparoscopy    . Lithotripsy     Family History  Problem Relation Age of Onset  . Diabetes Mother    History  Substance Use Topics  . Smoking status: Never Smoker   . Smokeless tobacco: Not on file  . Alcohol Use: No   OB History    No data available     Review of Systems  All other systems reviewed and are negative.   Allergies  Review of patient's allergies indicates no known allergies.  Home Medications   Prior to Admission medications   Medication Sig Start Date End Date Taking? Authorizing Provider  citalopram (CELEXA) 40 MG tablet Take 40 mg by mouth daily.   Yes Historical Provider, MD  Cyclobenzaprine HCl (FLEXERIL PO) Take by mouth.   Yes Historical Provider, MD  NAPROXEN PO Take by mouth.   Yes Historical  Provider, MD  aspirin 325 MG tablet Take 325 mg by mouth daily.    Historical Provider, MD  Black Cohosh 20 MG TABS Take 20 mg by mouth daily.    Historical Provider, MD  calcium carbonate (OS-CAL) 600 MG TABS tablet Take 600 mg by mouth 2 (two) times daily with a meal.    Historical Provider, MD  cetirizine (ZYRTEC) 10 MG tablet Take 1 tablet (10 mg total) by mouth daily. For allergy symptoms 06/04/14   Jacqulyn Cane, MD  docusate sodium (COLACE) 100 MG capsule Take 100 mg by mouth daily as needed for mild constipation.    Historical Provider, MD  ergocalciferol (VITAMIN D2) 50000 UNITS capsule Take 50,000 Units by mouth once a week. Mondays    Historical Provider, MD  estradiol (VIVELLE-DOT) 0.1 MG/24HR Place 1 patch onto the skin 2 (two) times a week. Every 4 days; per pt    Historical Provider, MD  ferrous sulfate 325 (65 FE) MG tablet Take 325 mg by mouth daily.      Historical Provider, MD  oxybutynin (DITROPAN) 5 MG tablet Take 1 tablet (5 mg total) by mouth 3 (three) times daily. 05/14/14   Janne Napoleon, NP   BP 118/81 mmHg  Pulse 64  Temp(Src) 98.3 F (36.8 C) (Oral)  Resp 12  Ht 5' 6.5" (1.689 m)  Wt 160 lb (72.576 kg)  BMI 25.44 kg/m2  SpO2 100% Physical Exam  Constitutional: She is oriented to person, place, and time. She appears well-developed and well-nourished. She is cooperative.  Non-toxic appearance. No distress.  HENT:  Head: Normocephalic and atraumatic.  Right Ear: Tympanic membrane, external ear and ear canal normal.  Left Ear: Tympanic membrane, external ear and ear canal normal.  Nose: Rhinorrhea (Mild, serous) present. Right sinus exhibits no maxillary sinus tenderness and no frontal sinus tenderness. Left sinus exhibits no maxillary sinus tenderness and no frontal sinus tenderness.  Mouth/Throat: Mucous membranes are normal. No oropharyngeal exudate, posterior oropharyngeal edema or posterior oropharyngeal erythema.  minimal serous drainage posterior pharynx  Eyes:  Conjunctivae are normal. No scleral icterus.  Neck: Neck supple.  Cardiovascular: Normal rate, regular rhythm and normal heart sounds.   No murmur heard. Pulmonary/Chest: Effort normal and breath sounds normal. No stridor. No respiratory distress. She has no wheezes. She has no rales.  Musculoskeletal: She exhibits no edema.  Lymphadenopathy:    She has no cervical adenopathy.       Right cervical: No superficial cervical, no deep cervical and no posterior cervical adenopathy present.      Left cervical: No superficial cervical, no deep cervical and no posterior cervical adenopathy present.  Neurological: She is alert and oriented to person, place, and time.  Skin: Skin is warm and dry.  Psychiatric: She has a normal mood and affect.  Nursing note and vitals reviewed.   ED Course  Procedures (including critical care time) Labs Review Labs Reviewed - No data to display  Imaging Review No results found.   MDM   1. Allergic rhinitis due to pollen    no sign of infection. Treatment options discussed, as well as risks, benefits, alternatives. She declined steroid nasal spray at this time. Patient voiced understanding and agreement with the following plans:  New Prescriptions   CETIRIZINE (ZYRTEC) 10 MG TABLET    Take 1 tablet (10 mg total) by mouth daily. For allergy symptoms  We briefly discussed some other issues ,but I advised her to follow-up with PCP for all chronic problems and referrals. Precautions discussed. Red flags discussed. Questions invited and answered. Patient voiced understanding and agreement.    Jacqulyn Cane, MD 06/04/14 2003

## 2014-07-02 ENCOUNTER — Encounter (HOSPITAL_COMMUNITY): Payer: Self-pay | Admitting: *Deleted

## 2014-07-02 ENCOUNTER — Emergency Department (HOSPITAL_COMMUNITY)
Admission: EM | Admit: 2014-07-02 | Discharge: 2014-07-02 | Disposition: A | Discharge status: Home / Self Care | Payer: Medicaid Other | Attending: Emergency Medicine | Admitting: Emergency Medicine

## 2014-07-02 DIAGNOSIS — Z87442 Personal history of urinary calculi: Secondary | ICD-10-CM | POA: Diagnosis not present

## 2014-07-02 DIAGNOSIS — Z8719 Personal history of other diseases of the digestive system: Secondary | ICD-10-CM | POA: Diagnosis not present

## 2014-07-02 DIAGNOSIS — R350 Frequency of micturition: Secondary | ICD-10-CM | POA: Diagnosis present

## 2014-07-02 DIAGNOSIS — Z8572 Personal history of non-Hodgkin lymphomas: Secondary | ICD-10-CM | POA: Insufficient documentation

## 2014-07-02 DIAGNOSIS — N390 Urinary tract infection, site not specified: Secondary | ICD-10-CM | POA: Insufficient documentation

## 2014-07-02 DIAGNOSIS — F329 Major depressive disorder, single episode, unspecified: Secondary | ICD-10-CM | POA: Insufficient documentation

## 2014-07-02 DIAGNOSIS — N319 Neuromuscular dysfunction of bladder, unspecified: Secondary | ICD-10-CM | POA: Diagnosis not present

## 2014-07-02 DIAGNOSIS — Z7982 Long term (current) use of aspirin: Secondary | ICD-10-CM | POA: Insufficient documentation

## 2014-07-02 DIAGNOSIS — Z79899 Other long term (current) drug therapy: Secondary | ICD-10-CM | POA: Diagnosis not present

## 2014-07-02 LAB — COMPREHENSIVE METABOLIC PANEL
ALK PHOS: 47 U/L (ref 39–117)
ALT: 34 U/L (ref 0–35)
ANION GAP: 8 (ref 5–15)
AST: 35 U/L (ref 0–37)
Albumin: 4 g/dL (ref 3.5–5.2)
BILIRUBIN TOTAL: 0.3 mg/dL (ref 0.3–1.2)
BUN: 5 mg/dL — AB (ref 6–23)
CALCIUM: 9.3 mg/dL (ref 8.4–10.5)
CO2: 28 mmol/L (ref 19–32)
CREATININE: 0.95 mg/dL (ref 0.50–1.10)
Chloride: 105 mmol/L (ref 96–112)
GFR, EST AFRICAN AMERICAN: 84 mL/min — AB (ref >90)
GFR, EST NON AFRICAN AMERICAN: 72 mL/min — AB (ref >90)
GLUCOSE: 140 mg/dL — AB (ref 70–99)
Potassium: 3.5 mmol/L (ref 3.5–5.1)
Sodium: 141 mmol/L (ref 135–145)
Total Protein: 6.9 g/dL (ref 6.0–8.3)

## 2014-07-02 LAB — CBC
HEMATOCRIT: 35.6 % — AB (ref 36.0–46.0)
HEMOGLOBIN: 11.4 g/dL — AB (ref 12.0–15.0)
MCH: 27.3 pg (ref 26.0–34.0)
MCHC: 32 g/dL (ref 30.0–36.0)
MCV: 85.2 fL (ref 78.0–100.0)
PLATELETS: 168 10*3/uL (ref 150–400)
RBC: 4.18 MIL/uL (ref 3.87–5.11)
RDW: 13.9 % (ref 11.5–15.5)
WBC: 5.7 10*3/uL (ref 4.0–10.5)

## 2014-07-02 LAB — URINALYSIS, ROUTINE W REFLEX MICROSCOPIC
BILIRUBIN URINE: NEGATIVE
GLUCOSE, UA: NEGATIVE mg/dL
HGB URINE DIPSTICK: NEGATIVE
Ketones, ur: 15 mg/dL — AB
Nitrite: NEGATIVE
PROTEIN: NEGATIVE mg/dL
SPECIFIC GRAVITY, URINE: 1.026 (ref 1.005–1.030)
UROBILINOGEN UA: 0.2 mg/dL (ref 0.0–1.0)
pH: 6 (ref 5.0–8.0)

## 2014-07-02 LAB — URINE MICROSCOPIC-ADD ON

## 2014-07-02 MED ORDER — CEPHALEXIN 500 MG PO CAPS: 500.0000 mg | ORAL_CAPSULE | Freq: Two times a day (BID) | ORAL | Status: DC

## 2014-07-02 MED ORDER — OXYBUTYNIN CHLORIDE 5 MG PO TABS: 5.0000 mg | ORAL_TABLET | Freq: Three times a day (TID) | ORAL | Status: DC

## 2014-07-02 NOTE — ED Notes (Addendum)
Pt with hx of urinary incontinence after kidney surgery 2 years ago.  Pt has an appointment with [facility] next week, but she is out of oxybutynin and she is filling up pads with urine.  Pt also c/o her chronic lower abdominal pain.

## 2014-07-02 NOTE — ED Provider Notes (Signed)
CSN: [id number]     Arrival date & time 07/02/14  1526 History   First MD Initiated Contact with Patient 07/02/14 1643     Chief Complaint  Patient presents with  . Urinary Incontinence     (Consider location/radiation/quality/duration/timing/severity/associated sxs/prior Treatment) The history is provided by the patient.     Patient with hx renal stones, lymphoma (2011, in remission), p/w recurrence of urinary dribbling.  States she had surgery for renal stones two years ago and had a complication afterwards that involved her bladder being stretched, has since has intermittent problems with urinary dribbling, urgency, frequency that comes and goes, most recently began a few weeks ago, started back on her medication Oxybutinin by urgent care - states when she takes this TID she has complete relief of her symptoms.  Has chronic mild pain over her bladder x 2 years that is unchanged.  States this feels exactly like her previously symptoms.  Has an appointment with her urologist on 07/13/14 at [facility].  Denies fevers, N/V, change in chronic abdominal pain, bowel or vaginal complaints.  She is s/p total hysterectomy for endometriosis.    Past Medical History  Diagnosis Date  . Renal stones   . Lymphoma   . Renal stones   . IBS (irritable bowel syndrome)   . Depression    Past Surgical History  Procedure Laterality Date  . Lithotripsy    . Abdominal hysterectomy    . Laparoscopy    . Lithotripsy     Family History  Problem Relation Age of Onset  . Diabetes Mother    History  Substance Use Topics  . Smoking status: Never Smoker   . Smokeless tobacco: Not on file  . Alcohol Use: No   OB History    No data available     Review of Systems  All other systems reviewed and are negative.     Allergies  Review of patient's allergies indicates no known allergies.  Home Medications   Prior to Admission medications   Medication Sig Start Date End Date Taking? Authorizing  Provider  aspirin 325 MG tablet Take 325 mg by mouth daily.    Historical Provider, MD  Black Cohosh 20 MG TABS Take 20 mg by mouth daily.    Historical Provider, MD  calcium carbonate (OS-CAL) 600 MG TABS tablet Take 600 mg by mouth 2 (two) times daily with a meal.    Historical Provider, MD  cetirizine (ZYRTEC) 10 MG tablet Take 1 tablet (10 mg total) by mouth daily. For allergy symptoms 06/04/14   Jacqulyn Cane, MD  citalopram (CELEXA) 40 MG tablet Take 40 mg by mouth daily.    Historical Provider, MD  Cyclobenzaprine HCl (FLEXERIL PO) Take by mouth.    Historical Provider, MD  docusate sodium (COLACE) 100 MG capsule Take 100 mg by mouth daily as needed for mild constipation.    Historical Provider, MD  ergocalciferol (VITAMIN D2) 50000 UNITS capsule Take 50,000 Units by mouth once a week. Mondays    Historical Provider, MD  estradiol (VIVELLE-DOT) 0.1 MG/24HR Place 1 patch onto the skin 2 (two) times a week. Every 4 days; per pt    Historical Provider, MD  ferrous sulfate 325 (65 FE) MG tablet Take 325 mg by mouth daily.      Historical Provider, MD  NAPROXEN PO Take by mouth.    Historical Provider, MD  oxybutynin (DITROPAN) 5 MG tablet Take 1 tablet (5 mg total) by mouth 3 (three) times daily. 05/14/14  Janne Napoleon, NP   BP 129/68 mmHg  Pulse 98  Temp(Src) 98.5 F (36.9 C) (Oral)  Resp 20  Ht 5\' 6"  (1.676 m)  Wt 167 lb 4 oz (75.864 kg)  BMI 27.01 kg/m2  SpO2 97% Physical Exam  Constitutional: She appears well-developed and well-nourished. No distress.  HENT:  Head: Normocephalic and atraumatic.  Neck: Neck supple.  Cardiovascular: Normal rate and regular rhythm.   Pulmonary/Chest: Effort normal and breath sounds normal. No respiratory distress. She has no wheezes. She has no rales.  Abdominal: Soft. She exhibits no distension. There is no tenderness. There is no rebound and no guarding.  Neurological: She is alert.  Skin: She is not diaphoretic.  Nursing note and vitals  reviewed.   ED Course  Procedures (including critical care time) Labs Review Labs Reviewed  URINALYSIS, ROUTINE W REFLEX MICROSCOPIC - Abnormal; Notable for the following:    APPearance CLOUDY (*)    Ketones, ur 15 (*)    Leukocytes, UA LARGE (*)    All other components within normal limits  COMPREHENSIVE METABOLIC PANEL - Abnormal; Notable for the following:    Glucose, Bld 140 (*)    BUN 5 (*)    GFR calc non Af Amer 72 (*)    GFR calc Af Amer 84 (*)    All other components within normal limits  CBC - Abnormal; Notable for the following:    Hemoglobin 11.4 (*)    HCT 35.6 (*)    All other components within normal limits  URINE MICROSCOPIC-ADD ON - Abnormal; Notable for the following:    Bacteria, UA FEW (*)    All other components within normal limits  URINE CULTURE    Imaging Review No results found.   EKG Interpretation None      MDM   Final diagnoses:  Bladder dysfunction  UTI (lower urinary tract infection)    Afebrile, nontoxic patient with chronic bladder issues including dribbling, frequency, incomplete emptying, since urologic surgery two years ago.  No change in chronic recurrent symptoms.  Has urology follow up 07/13/14.  Labs remarkable for mild anemia only.  UA with few bacteria but 11-20 WBC. Culture pending. Engaged in joint decision making with the patient.  Will treat with abx for simple UTI with keflex as well as giving her course of oxybutinin.    D/C home with urology at [facility].  Discussed result, findings, treatment, and follow up  with patient.  Pt given return precautions.  Pt verbalizes understanding and agrees with plan.        Clayton Bibles, PA-C 07/02/14 2233  Daleen Bo, MD 07/02/14 [phone]

## 2014-07-02 NOTE — Discharge Instructions (Signed)
Read the information below.  Use the prescribed medication as directed.  Please discuss all new medications with your pharmacist.  You may return to the Emergency Department at any time for worsening condition or any new symptoms that concern you.   If you develop high fevers, worsening abdominal pain, uncontrolled vomiting, or are unable to tolerate fluids by mouth, return to the ER for a recheck.      Urinary Tract Infection Urinary tract infections (UTIs) can develop anywhere along your urinary tract. Your urinary tract is your body's drainage system for removing wastes and extra water. Your urinary tract includes two kidneys, two ureters, a bladder, and a urethra. Your kidneys are a pair of bean-shaped organs. Each kidney is about the size of your fist. They are located below your ribs, one on each side of your spine. CAUSES Infections are caused by microbes, which are microscopic organisms, including fungi, viruses, and bacteria. These organisms are so small that they can only be seen through a microscope. Bacteria are the microbes that most commonly cause UTIs. SYMPTOMS  Symptoms of UTIs may vary by age and gender of the patient and by the location of the infection. Symptoms in young women typically include a frequent and intense urge to urinate and a painful, burning feeling in the bladder or urethra during urination. Older women and men are more likely to be tired, shaky, and weak and have muscle aches and abdominal pain. A fever may mean the infection is in your kidneys. Other symptoms of a kidney infection include pain in your back or sides below the ribs, nausea, and vomiting. DIAGNOSIS To diagnose a UTI, your caregiver will ask you about your symptoms. Your caregiver also will ask to provide a urine sample. The urine sample will be tested for bacteria and white blood cells. White blood cells are made by your body to help fight infection. TREATMENT  Typically, UTIs can be treated with  medication. Because most UTIs are caused by a bacterial infection, they usually can be treated with the use of antibiotics. The choice of antibiotic and length of treatment depend on your symptoms and the type of bacteria causing your infection. HOME CARE INSTRUCTIONS  If you were prescribed antibiotics, take them exactly as your caregiver instructs you. Finish the medication even if you feel better after you have only taken some of the medication.  Drink enough water and fluids to keep your urine clear or pale yellow.  Avoid caffeine, tea, and carbonated beverages. They tend to irritate your bladder.  Empty your bladder often. Avoid holding urine for long periods of time.  Empty your bladder before and after sexual intercourse.  After a bowel movement, women should cleanse from front to back. Use each tissue only once. SEEK MEDICAL CARE IF:   You have back pain.  You develop a fever.  Your symptoms do not begin to resolve within 3 days. SEEK IMMEDIATE MEDICAL CARE IF:   You have severe back pain or lower abdominal pain.  You develop chills.  You have nausea or vomiting.  You have continued burning or discomfort with urination. MAKE SURE YOU:   Understand these instructions.  Will watch your condition.  Will get help right away if you are not doing well or get worse. Document Released: 12/14/2004 Document Revised: 09/05/2011 Document Reviewed: 04/14/2011 [facility] Patient Information 2015 [place], [place]. This information is not intended to replace advice given to you by your health care provider. Make sure you discuss any questions you  have with your health care provider. ° °

## 2014-07-05 LAB — URINE CULTURE

## 2014-07-06 ENCOUNTER — Telehealth (HOSPITAL_COMMUNITY): Payer: Self-pay

## 2014-07-06 NOTE — ED Notes (Signed)
Post ED Visit - Positive Culture Follow-up  Culture report reviewed by antimicrobial stewardship pharmacist: []  Wes Franklin, Pharm.D., BCPS []  Heide Guile, Pharm.D., BCPS [x]  Alycia Rossetti, Pharm.D., BCPS []  [place], Pharm.D., BCPS, AAHIVP []  Legrand Como, Pharm.D., BCPS, AAHIVP []  Isac Sarna, Pharm.D., BCPS  Positive urine culture Treated with cephalexin, organism sensitive to the same and no further patient follow-up is required at this time.  Ileene Musa 07/06/2014, 10:47 AM

## 2014-08-01 ENCOUNTER — Emergency Department (HOSPITAL_COMMUNITY)
Admission: EM | Admit: 2014-08-01 | Discharge: 2014-08-01 | Disposition: A | Discharge status: Home / Self Care | Payer: Medicaid Other | Source: Home / Self Care | Attending: Family Medicine | Admitting: Family Medicine

## 2014-08-01 DIAGNOSIS — J069 Acute upper respiratory infection, unspecified: Secondary | ICD-10-CM | POA: Diagnosis not present

## 2014-08-01 DIAGNOSIS — N390 Urinary tract infection, site not specified: Secondary | ICD-10-CM | POA: Diagnosis not present

## 2014-08-01 DIAGNOSIS — J029 Acute pharyngitis, unspecified: Secondary | ICD-10-CM

## 2014-08-01 LAB — POCT PREGNANCY, URINE: PREG TEST UR: NEGATIVE

## 2014-08-01 LAB — POCT URINALYSIS DIP (DEVICE)
Bilirubin Urine: NEGATIVE
Glucose, UA: NEGATIVE mg/dL
Ketones, ur: NEGATIVE mg/dL
NITRITE: POSITIVE — AB
Protein, ur: >=300 mg/dL — AB
Urobilinogen, UA: 0.2 mg/dL (ref 0.0–1.0)
pH: 7 (ref 5.0–8.0)

## 2014-08-01 LAB — POCT RAPID STREP A: Streptococcus, Group A Screen (Direct): NEGATIVE

## 2014-08-01 MED ORDER — NITROFURANTOIN MONOHYD MACRO 100 MG PO CAPS: 100.0000 mg | ORAL_CAPSULE | Freq: Two times a day (BID) | ORAL | Status: DC

## 2014-08-01 NOTE — ED Provider Notes (Signed)
CSN: [id number]     Arrival date & time 08/01/14  1219 History   First MD Initiated Contact with Patient 08/01/14 1348     Chief Complaint  Patient presents with  . Dysuria   (Consider location/radiation/quality/duration/timing/severity/associated sxs/prior Treatment) Patient is a 44 y.o. female presenting with dysuria and pharyngitis. The history is provided by the patient.  Dysuria Pain quality:  Burning Pain severity:  Moderate Onset quality:  Gradual Duration:  3 weeks Timing:  Constant Progression:  Unchanged Recent urinary tract infections: yes (was seen at ER one month ago for same and only took 2 days of medication prescribed for UTI)   Associated symptoms: no flank pain and no vaginal discharge   Sore Throat This is a new problem. The current episode started 2 days ago. The problem occurs constantly. The problem has not changed since onset.   Past Medical History  Diagnosis Date  . Renal stones   . Lymphoma   . Renal stones   . IBS (irritable bowel syndrome)   . Depression    Past Surgical History  Procedure Laterality Date  . Lithotripsy    . Abdominal hysterectomy    . Laparoscopy    . Lithotripsy     Family History  Problem Relation Age of Onset  . Diabetes Mother    History  Substance Use Topics  . Smoking status: Never Smoker   . Smokeless tobacco: Not on file  . Alcohol Use: No   OB History    No data available     Review of Systems  Constitutional: Negative.   HENT: Positive for congestion, rhinorrhea and sore throat. Negative for trouble swallowing.   Eyes: Negative.   Respiratory: Negative.   Cardiovascular: Negative.   Gastrointestinal: Negative.   Genitourinary: Positive for dysuria, frequency and pelvic pain. Negative for urgency, hematuria, flank pain, vaginal bleeding, vaginal discharge, difficulty urinating and vaginal pain.  Musculoskeletal: Negative for back pain.  Skin: Negative.     Allergies  Review of patient's allergies  indicates no known allergies.  Home Medications   Prior to Admission medications   Medication Sig Start Date End Date Taking? Authorizing Provider  aspirin 325 MG tablet Take 325 mg by mouth daily.    Historical Provider, MD  Black Cohosh 20 MG TABS Take 20 mg by mouth daily.    Historical Provider, MD  calcium carbonate (OS-CAL) 600 MG TABS tablet Take 600 mg by mouth 2 (two) times daily with a meal.    Historical Provider, MD  cetirizine (ZYRTEC) 10 MG tablet Take 1 tablet (10 mg total) by mouth daily. For allergy symptoms 06/04/14   Jacqulyn Cane, MD  citalopram (CELEXA) 40 MG tablet Take 40 mg by mouth daily.    Historical Provider, MD  Cyclobenzaprine HCl (FLEXERIL PO) Take by mouth.    Historical Provider, MD  docusate sodium (COLACE) 100 MG capsule Take 100 mg by mouth daily as needed for mild constipation.    Historical Provider, MD  ergocalciferol (VITAMIN D2) 50000 UNITS capsule Take 50,000 Units by mouth once a week. Mondays    Historical Provider, MD  estradiol (VIVELLE-DOT) 0.1 MG/24HR Place 1 patch onto the skin 2 (two) times a week. Every 4 days; per pt    Historical Provider, MD  ferrous sulfate 325 (65 FE) MG tablet Take 325 mg by mouth daily.      Historical Provider, MD  NAPROXEN PO Take by mouth.    Historical Provider, MD  nitrofurantoin, macrocrystal-monohydrate, (MACROBID) 100 MG  capsule Take 1 capsule (100 mg total) by mouth 2 (two) times daily. 08/01/14   Audelia Hives Lonald Troiani, PA  oxybutynin (DITROPAN) 5 MG tablet Take 1 tablet (5 mg total) by mouth 3 (three) times daily. 07/02/14   Clayton Bibles, PA-C   BP 116/74 mmHg  Pulse 77  Temp(Src) 98.4 F (36.9 C) (Oral)  Resp 16  SpO2 98% Physical Exam  Constitutional: She is oriented to person, place, and time. She appears well-developed and well-nourished. No distress.  HENT:  Head: Normocephalic and atraumatic.  Right Ear: Hearing, tympanic membrane, external ear and ear canal normal.  Left Ear: Hearing, tympanic  membrane, external ear and ear canal normal.  Nose: Nose normal.  Mouth/Throat: Uvula is midline, oropharynx is clear and moist and mucous membranes are normal.  Eyes: Conjunctivae are normal. Right eye exhibits no discharge. Left eye exhibits no discharge. No scleral icterus.  Neck: Normal range of motion. Neck supple.  Cardiovascular: Normal rate, regular rhythm and normal heart sounds.   Pulmonary/Chest: Effort normal and breath sounds normal. No stridor.  Abdominal: Soft. Normal appearance and bowel sounds are normal. She exhibits no distension. There is no tenderness. There is no CVA tenderness.  Musculoskeletal: Normal range of motion.  Lymphadenopathy:    She has no cervical adenopathy.  Neurological: She is alert and oriented to person, place, and time.  Skin: Skin is warm and dry.  Psychiatric: She has a normal mood and affect. Her behavior is normal.  Nursing note and vitals reviewed.   ED Course  Procedures (including critical care time) Labs Review Labs Reviewed  POCT URINALYSIS DIP (DEVICE) - Abnormal; Notable for the following:    Hgb urine dipstick LARGE (*)    Protein, ur >=300 (*)    Nitrite POSITIVE (*)    Leukocytes, UA MODERATE (*)    All other components within normal limits  URINE CULTURE  POCT PREGNANCY, URINE  POCT RAPID STREP A ([facility] URG CARE ONLY)    Imaging Review No results found.   MDM   1. URI (upper respiratory infection)   2. Sore throat   3. UTI (lower urinary tract infection)   Rapid strep negative. +UTI. Macrobid as prescribed. PCP follow up if no improvement    Lutricia Feil, [place] 08/01/14 1425

## 2014-08-01 NOTE — Discharge Instructions (Signed)
Test for strep throat is negative. You have a bladder infection. Please take medication as directed and follow up with Dr. Ruthann Cancer if symptoms do not improve. Tylenol or ibuprofen as directed on packaging for pain.  Salt Water Gargle This solution will help make your mouth and throat feel better. HOME CARE INSTRUCTIONS   Mix 1 teaspoon of salt in 8 ounces of warm water.  Gargle with this solution as much or often as you need or as directed. Swish and gargle gently if you have any sores or wounds in your mouth.  Do not swallow this mixture. Document Released: 12/09/2003 Document Revised: 05/29/2011 Document Reviewed: 05/01/2008 [facility] Patient Information 2015 [place], [place]. This information is not intended to replace advice given to you by your health care provider. Make sure you discuss any questions you have with your health care provider.  Sore Throat A sore throat is pain, burning, irritation, or scratchiness of the throat. There is often pain or tenderness when swallowing or talking. A sore throat may be accompanied by other symptoms, such as coughing, sneezing, fever, and swollen neck glands. A sore throat is often the first sign of another sickness, such as a cold, flu, strep throat, or mononucleosis (commonly known as mono). Most sore throats go away without medical treatment. CAUSES  The most common causes of a sore throat include:  A viral infection, such as a cold, flu, or mono.  A bacterial infection, such as strep throat, tonsillitis, or whooping cough.  Seasonal allergies.  Dryness in the air.  Irritants, such as smoke or pollution.  Gastroesophageal reflux disease (GERD). HOME CARE INSTRUCTIONS   Only take over-the-counter medicines as directed by your caregiver.  Drink enough fluids to keep your urine clear or pale yellow.  Rest as needed.  Try using throat sprays, lozenges, or sucking on hard candy to ease any pain (if older than 4 years or as  directed).  Sip warm liquids, such as broth, herbal tea, or warm water with honey to relieve pain temporarily. You may also eat or drink cold or frozen liquids such as frozen ice pops.  Gargle with salt water (mix 1 tsp salt with 8 oz of water).  Do not smoke and avoid secondhand smoke.  Put a cool-mist humidifier in your bedroom at night to moisten the air. You can also turn on a hot shower and sit in the bathroom with the door closed for 5-10 minutes. SEEK IMMEDIATE MEDICAL CARE IF:  You have difficulty breathing.  You are unable to swallow fluids, soft foods, or your saliva.  You have increased swelling in the throat.  Your sore throat does not get better in 7 days.  You have nausea and vomiting.  You have a fever or persistent symptoms for more than 2-3 days.  You have a fever and your symptoms suddenly get worse. MAKE SURE YOU:   Understand these instructions.  Will watch your condition.  Will get help right away if you are not doing well or get worse. Document Released: 04/13/2004 Document Revised: 02/21/2012 Document Reviewed: 11/12/2011 [facility] Patient Information 2015 [place], [place]. This information is not intended to replace advice given to you by your health care provider. Make sure you discuss any questions you have with your health care provider.  Urinary Tract Infection Urinary tract infections (UTIs) can develop anywhere along your urinary tract. Your urinary tract is your body's drainage system for removing wastes and extra water. Your urinary tract includes two kidneys, two ureters, a bladder,  and a urethra. Your kidneys are a pair of bean-shaped organs. Each kidney is about the size of your fist. They are located below your ribs, one on each side of your spine. CAUSES Infections are caused by microbes, which are microscopic organisms, including fungi, viruses, and bacteria. These organisms are so small that they can only be seen through a microscope. Bacteria  are the microbes that most commonly cause UTIs. SYMPTOMS  Symptoms of UTIs may vary by age and gender of the patient and by the location of the infection. Symptoms in young women typically include a frequent and intense urge to urinate and a painful, burning feeling in the bladder or urethra during urination. Older women and men are more likely to be tired, shaky, and weak and have muscle aches and abdominal pain. A fever may mean the infection is in your kidneys. Other symptoms of a kidney infection include pain in your back or sides below the ribs, nausea, and vomiting. DIAGNOSIS To diagnose a UTI, your caregiver will ask you about your symptoms. Your caregiver also will ask to provide a urine sample. The urine sample will be tested for bacteria and white blood cells. White blood cells are made by your body to help fight infection. TREATMENT  Typically, UTIs can be treated with medication. Because most UTIs are caused by a bacterial infection, they usually can be treated with the use of antibiotics. The choice of antibiotic and length of treatment depend on your symptoms and the type of bacteria causing your infection. HOME CARE INSTRUCTIONS  If you were prescribed antibiotics, take them exactly as your caregiver instructs you. Finish the medication even if you feel better after you have only taken some of the medication.  Drink enough water and fluids to keep your urine clear or pale yellow.  Avoid caffeine, tea, and carbonated beverages. They tend to irritate your bladder.  Empty your bladder often. Avoid holding urine for long periods of time.  Empty your bladder before and after sexual intercourse.  After a bowel movement, women should cleanse from front to back. Use each tissue only once. SEEK MEDICAL CARE IF:   You have back pain.  You develop a fever.  Your symptoms do not begin to resolve within 3 days. SEEK IMMEDIATE MEDICAL CARE IF:   You have severe back pain or lower  abdominal pain.  You develop chills.  You have nausea or vomiting.  You have continued burning or discomfort with urination. MAKE SURE YOU:   Understand these instructions.  Will watch your condition.  Will get help right away if you are not doing well or get worse. Document Released: 12/14/2004 Document Revised: 09/05/2011 Document Reviewed: 04/14/2011 [facility] Patient Information 2015 [place], [place]. This information is not intended to replace advice given to you by your health care provider. Make sure you discuss any questions you have with your health care provider.

## 2014-08-01 NOTE — ED Notes (Signed)
C/o dysuria and low abd pain. Pt was treated for a UTI 1 week ago , the med the pt was given made her vomit. Pt also c/o cough ,scratchy throat .

## 2014-08-03 LAB — URINE CULTURE: Colony Count: >=100000

## 2014-08-03 LAB — CULTURE, GROUP A STREP: Strep A Culture: NEGATIVE

## 2014-08-03 NOTE — ED Notes (Addendum)
Urine culture: >100,000 colonies E. Coli.  Pt. adequately treated with Macrobid.  Throat culture: neg.  Roselyn Meier 08/03/2014

## 2014-08-29 ENCOUNTER — Encounter (HOSPITAL_COMMUNITY): Payer: Self-pay | Admitting: Emergency Medicine

## 2014-08-29 ENCOUNTER — Emergency Department (HOSPITAL_COMMUNITY)
Admission: EM | Admit: 2014-08-29 | Discharge: 2014-08-29 | Disposition: A | Discharge status: Home / Self Care | Payer: Medicaid Other | Attending: Emergency Medicine | Admitting: Emergency Medicine

## 2014-08-29 DIAGNOSIS — Y939 Activity, unspecified: Secondary | ICD-10-CM | POA: Insufficient documentation

## 2014-08-29 DIAGNOSIS — F329 Major depressive disorder, single episode, unspecified: Secondary | ICD-10-CM | POA: Diagnosis not present

## 2014-08-29 DIAGNOSIS — S4991XA Unspecified injury of right shoulder and upper arm, initial encounter: Secondary | ICD-10-CM | POA: Diagnosis not present

## 2014-08-29 DIAGNOSIS — M545 Low back pain, unspecified: Secondary | ICD-10-CM

## 2014-08-29 DIAGNOSIS — W01198A Fall on same level from slipping, tripping and stumbling with subsequent striking against other object, initial encounter: Secondary | ICD-10-CM | POA: Insufficient documentation

## 2014-08-29 DIAGNOSIS — Z8719 Personal history of other diseases of the digestive system: Secondary | ICD-10-CM | POA: Insufficient documentation

## 2014-08-29 DIAGNOSIS — W010XXA Fall on same level from slipping, tripping and stumbling without subsequent striking against object, initial encounter: Secondary | ICD-10-CM

## 2014-08-29 DIAGNOSIS — M25511 Pain in right shoulder: Secondary | ICD-10-CM

## 2014-08-29 DIAGNOSIS — Y92481 Parking lot as the place of occurrence of the external cause: Secondary | ICD-10-CM | POA: Insufficient documentation

## 2014-08-29 DIAGNOSIS — Z79899 Other long term (current) drug therapy: Secondary | ICD-10-CM | POA: Diagnosis not present

## 2014-08-29 DIAGNOSIS — Z87442 Personal history of urinary calculi: Secondary | ICD-10-CM | POA: Insufficient documentation

## 2014-08-29 DIAGNOSIS — Z7982 Long term (current) use of aspirin: Secondary | ICD-10-CM | POA: Diagnosis not present

## 2014-08-29 DIAGNOSIS — S3992XA Unspecified injury of lower back, initial encounter: Secondary | ICD-10-CM | POA: Diagnosis not present

## 2014-08-29 DIAGNOSIS — Z8572 Personal history of non-Hodgkin lymphomas: Secondary | ICD-10-CM | POA: Diagnosis not present

## 2014-08-29 DIAGNOSIS — Y999 Unspecified external cause status: Secondary | ICD-10-CM | POA: Insufficient documentation

## 2014-08-29 MED ORDER — METHOCARBAMOL 500 MG PO TABS: 500.0000 mg | ORAL_TABLET | Freq: Two times a day (BID) | ORAL | Status: DC

## 2014-08-29 MED ORDER — OXYCODONE-ACETAMINOPHEN 5-325 MG PO TABS: 2.0000 | ORAL_TABLET | ORAL | Status: DC | PRN

## 2014-08-29 MED ORDER — ACETAMINOPHEN 325 MG PO TABS
650.0000 mg | ORAL_TABLET | Freq: Once | ORAL | Status: AC
  Administered 2014-08-29: 650 mg via ORAL
  Filled 2014-08-29: qty 2

## 2014-08-29 NOTE — ED Provider Notes (Signed)
CSN: [id number]     Arrival date & time 08/29/14  0015 History   First MD Initiated Contact with Patient 08/29/14 0036     Chief Complaint  Patient presents with  . Fall     (Consider location/radiation/quality/duration/timing/severity/associated sxs/prior Treatment) Patient is a 44 y.o. female presenting with fall. The history is provided by the patient. No language interpreter was used.  Fall Associated symptoms include myalgias. Pertinent negatives include no joint swelling, neck pain, numbness, rash or weakness.   Victoria Ali is a 44 year old female who presents after slip and fall in the [employer] parking lot this afternoon on spilled salad dressing. She states she tried to catch herself with her right arm. She is complaining of right shoulder pain and pain above her right hip. She states she did not hit her head and did not lose consciousness. She was ambulatory at the scene. She states she went home and felt fine and began having pain a few hours later. She denies any fever, chills, chest pain, shortness of breath, abdominal pain, nausea, vomiting,  urinary or bowel incontinence or retention. She states she has a history of Hodgkin's lymphoma.  Past Medical History  Diagnosis Date  . Renal stones   . Lymphoma   . Renal stones   . IBS (irritable bowel syndrome)   . Depression    Past Surgical History  Procedure Laterality Date  . Lithotripsy    . Abdominal hysterectomy    . Laparoscopy    . Lithotripsy     Family History  Problem Relation Age of Onset  . Diabetes Mother    History  Substance Use Topics  . Smoking status: Never Smoker   . Smokeless tobacco: Not on file  . Alcohol Use: No   OB History    No data available     Review of Systems  Musculoskeletal: Positive for myalgias and back pain. Negative for joint swelling, gait problem, neck pain and neck stiffness.  Skin: Negative for rash and wound.  Neurological: Negative for dizziness, syncope, weakness and  numbness.      Allergies  Review of patient's allergies indicates no known allergies.  Home Medications   Prior to Admission medications   Medication Sig Start Date End Date Taking? Authorizing Provider  aspirin 325 MG tablet Take 325 mg by mouth daily.    Historical Provider, MD  Black Cohosh 20 MG TABS Take 20 mg by mouth daily.    Historical Provider, MD  calcium carbonate (OS-CAL) 600 MG TABS tablet Take 600 mg by mouth 2 (two) times daily with a meal.    Historical Provider, MD  cetirizine (ZYRTEC) 10 MG tablet Take 1 tablet (10 mg total) by mouth daily. For allergy symptoms 06/04/14   Jacqulyn Cane, MD  citalopram (CELEXA) 40 MG tablet Take 40 mg by mouth daily.    Historical Provider, MD  Cyclobenzaprine HCl (FLEXERIL PO) Take by mouth.    Historical Provider, MD  docusate sodium (COLACE) 100 MG capsule Take 100 mg by mouth daily as needed for mild constipation.    Historical Provider, MD  ergocalciferol (VITAMIN D2) 50000 UNITS capsule Take 50,000 Units by mouth once a week. Mondays    Historical Provider, MD  estradiol (VIVELLE-DOT) 0.1 MG/24HR Place 1 patch onto the skin 2 (two) times a week. Every 4 days; per pt    Historical Provider, MD  ferrous sulfate 325 (65 FE) MG tablet Take 325 mg by mouth daily.      Historical Provider,  MD  methocarbamol (ROBAXIN) 500 MG tablet Take 1 tablet (500 mg total) by mouth 2 (two) times daily. 08/29/14   Korey Prashad Patel-Mills, PA-C  NAPROXEN PO Take by mouth.    Historical Provider, MD  nitrofurantoin, macrocrystal-monohydrate, (MACROBID) 100 MG capsule Take 1 capsule (100 mg total) by mouth 2 (two) times daily. 08/01/14   Audelia Hives Presson, PA  oxybutynin (DITROPAN) 5 MG tablet Take 1 tablet (5 mg total) by mouth 3 (three) times daily. 07/02/14   Clayton Bibles, PA-C  oxyCODONE-acetaminophen (PERCOCET/ROXICET) 5-325 MG per tablet Take 2 tablets by mouth every 4 (four) hours as needed for severe pain. 08/29/14   Jaythen Hamme Patel-Mills, PA-C   BP 114/66  mmHg  Pulse 72  Temp(Src) 97.7 F (36.5 C) (Oral)  Resp 16  SpO2 100% Physical Exam  Constitutional: She is oriented to person, place, and time. She appears well-developed and well-nourished.  HENT:  Head: Normocephalic and atraumatic.  Eyes: Conjunctivae are normal.  Neck: Normal range of motion. Neck supple.  Cardiovascular: Normal rate.   Pulmonary/Chest: Effort normal and breath sounds normal. No respiratory distress. She has no wheezes. She has no rales.  Abdominal: Soft. There is no tenderness.  Musculoskeletal: Normal range of motion.  Negative empty can test. She is able to abduct and abduct the right arm without difficulty. No right clavicular deformity. No shoulder deformity. Good radial pulse. No ecchymosis, erythema to the right shoulder.  No midline cervical, thoracic, lumbar spine. She has tenderness to the right paravertebral musculature on palpation. She is able to dorsiflex and plantar flex without difficulty. Negative straight leg raise. No ecchymosis, erythema to the flank or back area.  She is ambulatory with a steady gait.  Neurological: She is alert and oriented to person, place, and time.  Skin: Skin is warm and dry.    ED Course  Procedures (including critical care time) Labs Review Labs Reviewed - No data to display  Imaging Review No results found.   EKG Interpretation None      MDM   Final diagnoses:  Fall from slipping, initial encounter  Right-sided low back pain without sciatica  Right shoulder pain   Patient presents for right back pain and right shoulder pain after slip and fall today. She has a history of cancer but no midline spinal tenderness. She also has no deformity or exquisite pain. She has no weakness, numbness, bowel or bladder incontinence or retention. I do not believe imaging is necessary at this time. She is ambulatory with steady gait. I have given her Robaxin, Percocet. She states she cannot take ibuprofen due to kidney  problems. She can take Tylenol for pain as needed. She can follow-up with her PCP and I have referred her to [facility] per her request. She verbally agrees with the plan and will return for bowel and bladder incontinence or retention or extremity weakness or numbness.    Ottie Glazier, PA-C 08/29/14 1275  Julianne Rice, MD 08/29/14 [phone]

## 2014-08-29 NOTE — Discharge Instructions (Signed)
Back Exercises Take Tylenol for pain and Percocet for breakthrough pain. Take muscle relaxers as prescribed. Return for numbness and tingling in the extremity, urinary or bowel incontinence. Back exercises help treat and prevent back injuries. The goal is to increase your strength in your belly (abdominal) and back muscles. These exercises can also help with flexibility. Start these exercises when told by your doctor. HOME CARE Back exercises include: Pelvic Tilt.  Lie on your back with your knees bent. Tilt your pelvis until the lower part of your back is against the floor. Hold this position 5 to 10 sec. Repeat this exercise 5 to 10 times. Knee to Chest.  Pull 1 knee up against your chest and hold for 20 to 30 seconds. Repeat this with the other knee. This may be done with the other leg straight or bent, whichever feels better. Then, pull both knees up against your chest. Sit-Ups or Curl-Ups.  Bend your knees 90 degrees. Start with tilting your pelvis, and do a partial, slow sit-up. Only lift your upper half 30 to 45 degrees off the floor. Take at least 2 to 3 seonds for each sit-up. Do not do sit-ups with your knees out straight. If partial sit-ups are difficult, simply do the above but with only tightening your belly (abdominal) muscles and holding it as told. Hip-Lift.  Lie on your back with your knees flexed 90 degrees. Push down with your feet and shoulders as you raise your hips 2 inches off the floor. Hold for 10 seconds, repeat 5 to 10 times. Back Arches.  Lie on your stomach. Prop yourself up on bent elbows. Slowly press on your hands, causing an arch in your low back. Repeat 3 to 5 times. Shoulder-Lifts.  Lie face down with arms beside your body. Keep hips and belly pressed to floor as you slowly lift your head and shoulders off the floor. Do not overdo your exercises. Be careful in the beginning. Exercises may cause you some mild back discomfort. If the pain lasts for more than 15  minutes, stop the exercises until you see your doctor. Improvement with exercise for back problems is slow.  Document Released: 04/08/2010 Document Revised: 05/29/2011 Document Reviewed: 01/05/2011 [facility] Patient Information 2015 [place], [place]. This information is not intended to replace advice given to you by your health care provider. Make sure you discuss any questions you have with your health care provider.  Emergency Department Resource Guide 1) Find a Doctor and Pay Out of Pocket Although you won't have to find out who is covered by your insurance plan, it is a good idea to ask around and get recommendations. You will then need to call the office and see if the doctor you have chosen will accept you as a new patient and what types of options they offer for patients who are self-pay. Some doctors offer discounts or will set up payment plans for their patients who do not have insurance, but you will need to ask so you aren't surprised when you get to your appointment.  2) Contact Your Local Health Department Not all health departments have doctors that can see patients for sick visits, but many do, so it is worth a call to see if yours does. If you don't know where your local health department is, you can check in your phone book. The CDC also has a tool to help you locate your state's health department, and many state websites also have listings of all of their local health departments.  3) Find a [facility] If your illness is not likely to be very severe or complicated, you may want to try a walk in clinic. These are popping up all over the country in pharmacies, drugstores, and shopping centers. They're usually staffed by nurse practitioners or physician assistants that have been trained to treat common illnesses and complaints. They're usually fairly quick and inexpensive. However, if you have serious medical issues or chronic medical problems, these are probably not your best  option.  No Primary Care Doctor: - Call Health Connect at  [phone] - they can help you locate a primary care doctor that  accepts your insurance, provides certain services, etc. - Physician Referral Service- [phone]  Chronic Pain Problems: Organization         Address  Phone   Notes  [facility]  [phone] Patients need to be referred by their primary care doctor.   Medication Assistance: Organization         Address  Phone   Notes  [facility] Medication [facility] [address] [place], [place] [address] [phone] --Must be a resident of [facility] -- Must have NO insurance coverage whatsoever (no Medicaid/ Medicare, etc.) -- The pt. MUST have a primary care doctor that directs their care regularly and follows them in the community   [facility]ist  [phone]   [employer]  [phone]    Agencies that provide inexpensive medical care: Organization         Address  Phone   Notes  [place]  [phone]   [facility]    [phone]   [facility] [place],  [address] [phone]   [place] [address] [place] [phone]   Planned Parenthood    [phone]   [facility]    [phone]   [place] and [place] [address], [place] Phone:  [phone], Fax:  [phone] Hours of Operation:  9 am - 6 pm, M-F.  Also accepts Medicaid/Medicare and self-pay.  [facility] for [place] [address], [address], [place] Phone: [phone], Fax: [phone]. Hours of Operation:  8:30 am - 5:30 pm, M-F.  Also accepts Medicaid and self-pay.  [facility] [place] [address] [place] Phone: [phone]   [place], [place], [place] [phone], Ext. [phone] Mondays & Thursdays: 7-9 AM.  First 15  patients are seen on a first come, first serve basis.    [place] Providers:  Organization         Address  Phone   Notes  [facility] [address], [place] [phone] Also accepts self-pay patients.  [facility] [address], [place]  [phone]   [place], [address], [place] [phone]   [facility] [address], [place] [phone]   Lucianne Lei [address], [place]   [phone] Only accepts [place] Access [place] patients after they have their name applied to their card.   Self-Pay (no insurance) in [facility]:  Organization         Address  Phone   Notes  Sickle Cell Patients, [facility] [phone]  [place] [phone]   [facility] [place] [phone]   [facility] [place]  [place], [place], [place] [phone]   [facility]/Dr. Osei-Bonsu  [address] [place] or [address], [place] [phone] Phone number for both [place] and [place] locations is the same.  [facility] [address] [place] [phone]   [facility] [address] [place] or [address] [phone]   [facility] [address] [place] [phone], phone; [phone], fax Sees patients 1st and 3rd Saturday of every month.  Must not qualify for public or private insurance (i.e. Medicaid, Medicare, [place] Health Choice, Veterans' Benefits)  Household income should be no more than 200% of the poverty level The clinic cannot treat you if you are pregnant or think you are pregnant  Sexually transmitted diseases are not treated at the clinic.    Dental  Care: Organization         Address  Phone  Notes  [facility] Department of [facility] [phone] Accepts children up to age 18 who are enrolled in [place] or [place]; pregnant women with a Medicaid card; and children who have applied for Medicaid or [place] Health Choice, but were declined, whose parents can pay a reduced fee at time of service.  [facility] Department of [facility]  [address], [place] [phone] Accepts children up to age 40 who are enrolled in [place] or [place]; pregnant women with a Medicaid card; and children who have applied for Medicaid or [place] Health Choice, but were declined, whose parents can pay a reduced fee at time of service.  [place] Adult Dental Access PROGRAM  [place] [phone] Patients are seen by appointment only. Walk-ins are not accepted. [place] will see patients 17 years of age and older. Monday - Tuesday (8am-5pm) Most Wednesdays (8:30-5pm) $30 per visit, cash only  [facility] Adult Dental Access PROGRAM  [address], [facility] [phone] Patients are seen by appointment only. Walk-ins are not accepted. [place] will see patients 34 years of age and older. One Wednesday Evening (Monthly: Volunteer Based).  $30 per visit, cash only  [facility]  [phone] for adults; Children under age 48, call Graduate Pediatric Dentistry at [phone]. Children aged 57-14, please call [phone] to request a pediatric application.  Dental services are provided in all areas of dental care including fillings, crowns and bridges, complete and partial dentures, implants, gum treatment, root canals, and extractions. Preventive care is also provided. Treatment is provided to both adults and children. Patients are selected via a lottery and there is often a waiting list.   [facility] [address], [place]  [phone] [email or web address]   [facility] [address] [place], [place] [phone], Ext. [phone] Second and Fourth Thursday of each month, opens at 6:30 AM; Clinic ends at 9 AM.  Patients are seen on a first-come first-served basis, and a limited number are seen during each clinic.   [facility]  [address] Hillard Danker [place], [place] [phone]   Eligibility Requirements You must have lived in [place],  [place], or [place] counties for at least the last three months.   You cannot be eligible for state or federal sponsored [employer], including [employer], [place], or [employer].   You generally cannot be eligible for healthcare insurance through your employer.    How to apply: Eligibility screenings are held every Tuesday and Wednesday afternoon from 1:00 pm until 4:00 pm. You do not need an appointment for the interview!  [facility] [address] [place], [place]   [place]  [place] Department  [place]  [phone]    Behavioral Health Resources in the Community: Intensive Outpatient Programs Organization         Address  Phone  Notes  [place]. [address] [place], [place] [phone]   [facility] Outpatient [address], [place], [place]   ADS: Alcohol & Drug Svcs [address] [place], [place]   [place] [address],  [place], [place] or [phone]   Substance Abuse Resources Organization         Address  Phone  Notes  Alcohol and Drug Services  [phone]   [place]  [phone]   [facility]   Chinita Pester  [phone]   Residential & Outpatient Substance Abuse Program  [phone]    Psychological Services Organization         Address  Phone  Notes  [facility] [place]  [place]  [phone]   [place] [address] [place] or [phone]    Mobile Crisis Teams Organization         Address  Phone  Notes  [facility], Mobile Crisis Care Unit  [phone]   [facility]  [address] [place], [place]   [place] [address] [place] [phone]    Self-Help/Support Groups Organization         Address  Phone             Notes  [place]. of [place] - variety of support groups  [place] Call for more information  Narcotics Anonymous (NA), [facility] [address], [employer] [place]  2 meetings at this location   Special educational needs teacher         Address  Phone  Notes  [facility] Residential Treatment [place],    [place]  [phone]   [facility]  [address] [place] [address]37628, [place], [place]   [place] [facility], [place] [phone] Admissions: 8am-3pm M-F  Incentives Substance [place] [address],    [place], [place] [phone]   [facility] [address] Jadene Pierini [place], [place]   [facility] [address]  [place], [place]   [facility] - Intensive Outpatient [address], [place], [place]   [facility] [place].,  [place], [place] or [phone]   [facility] (RTS) [address], [place], [place] Accepts Medicaid  [facility] [address]  [place] [phone] Substance Abuse/Addiction Treatment   [facility] Resources Organization         Address  Phone  Notes  [facility]  Human  Services  [phone]   Domenic Schwab, PhD [address] Arlis [place], [place]   [phone] or [phone]   [facility] [place], [place] [phone]   [address], [place], [place] [phone] Insurance/Medicaid/sponsorship through [facility] and Families [address] [place]                                    [place], [place] [phone] [place] [address], [place] [phone]    Dr. Adele Schilder  [phone]   [facility] [place] Dept. 1) [address] [place] 2) [place] 3)  [address], [place] [phone]  [phone]   [place] [phone] or [phone] (After Hours)

## 2014-08-29 NOTE — ED Notes (Signed)
Pt. slipped on a parking lot today and hit the ground , no LOC / ambulatory , reports low back pain and right upper back pain. Alert and oriented/ respirations unlabored.

## 2014-09-02 ENCOUNTER — Ambulatory Visit: Payer: Medicaid Other

## 2014-10-09 ENCOUNTER — Emergency Department (INDEPENDENT_AMBULATORY_CARE_PROVIDER_SITE_OTHER)
Admission: EM | Admit: 2014-10-09 | Discharge: 2014-10-09 | Disposition: A | Discharge status: Home / Self Care | Payer: Medicaid Other | Attending: Family Medicine | Admitting: Family Medicine

## 2014-10-09 ENCOUNTER — Encounter (HOSPITAL_COMMUNITY): Payer: Self-pay | Admitting: Emergency Medicine

## 2014-10-09 DIAGNOSIS — S39012A Strain of muscle, fascia and tendon of lower back, initial encounter: Secondary | ICD-10-CM

## 2014-10-09 DIAGNOSIS — M5430 Sciatica, unspecified side: Secondary | ICD-10-CM

## 2014-10-09 MED ORDER — DEXAMETHASONE SODIUM PHOSPHATE 10 MG/ML IJ SOLN
INTRAMUSCULAR | Status: AC
  Filled 2014-10-09: qty 1

## 2014-10-09 MED ORDER — DEXAMETHASONE SODIUM PHOSPHATE 10 MG/ML IJ SOLN
10.0000 mg | Freq: Once | INTRAMUSCULAR | Status: AC
  Administered 2014-10-09: 10 mg via INTRAMUSCULAR

## 2014-10-09 NOTE — Discharge Instructions (Signed)
You have flared your back muscles causing additional stress and strain and possible mild sciatic nerve irritation. Your given a dose of a steroid cold Decadron to help with the pain and inflammation. Please start using your muscle relaxers regularly as well as 2 Aleve twice a day to help with your pain. Please massage the area and apply heat as needed for additional relief. Please try to perform the exercises as best as you're able as outlined below. Please call your primary care physician's office for a follow-up visit as you may need further intervention and physical therapy.  Sciatica with Rehab The sciatic nerve runs from the back down the leg and is responsible for sensation and control of the muscles in the back (posterior) side of the thigh, lower leg, and foot. Sciatica is a condition that is characterized by inflammation of this nerve.  SYMPTOMS   Signs of nerve damage, including numbness and/or weakness along the posterior side of the lower extremity.  Pain in the back of the thigh that may also travel down the leg.  Pain that worsens when sitting for long periods of time.  Occasionally, pain in the back or buttock. CAUSES  Inflammation of the sciatic nerve is the cause of sciatica. The inflammation is due to something irritating the nerve. Common sources of irritation include:  Sitting for long periods of time.  Direct trauma to the nerve.  Arthritis of the spine.  Herniated or ruptured disk.  Slipping of the vertebrae (spondylolisthesis).  Pressure from soft tissues, such as muscles or ligament-like tissue (fascia). RISK INCREASES WITH:  Sports that place pressure or stress on the spine (football or weightlifting).  Poor strength and flexibility.  Failure to warm up properly before activity.  Family history of low back pain or disk disorders.  Previous back injury or surgery.  Poor body mechanics, especially when lifting, or poor posture. PREVENTION   Warm up  and stretch properly before activity.  Maintain physical fitness:  Strength, flexibility, and endurance.  Cardiovascular fitness.  Learn and use proper technique, especially with posture and lifting. When possible, have coach correct improper technique.  Avoid activities that place stress on the spine. PROGNOSIS If treated properly, then sciatica usually resolves within 6 weeks. However, occasionally surgery is necessary.  RELATED COMPLICATIONS   Permanent nerve damage, including pain, numbness, tingle, or weakness.  Chronic back pain.  Risks of surgery: infection, bleeding, nerve damage, or damage to surrounding tissues. TREATMENT Treatment initially involves resting from any activities that aggravate your symptoms. The use of ice and medication may help reduce pain and inflammation. The use of strengthening and stretching exercises may help reduce pain with activity. These exercises may be performed at home or with referral to a therapist. A therapist may recommend further treatments, such as transcutaneous electronic nerve stimulation (TENS) or ultrasound. Your caregiver may recommend corticosteroid injections to help reduce inflammation of the sciatic nerve. If symptoms persist despite non-surgical (conservative) treatment, then surgery may be recommended. MEDICATION  If pain medication is necessary, then nonsteroidal anti-inflammatory medications, such as aspirin and ibuprofen, or other minor pain relievers, such as acetaminophen, are often recommended.  Do not take pain medication for 7 days before surgery.  Prescription pain relievers may be given if deemed necessary by your caregiver. Use only as directed and only as much as you need.  Ointments applied to the skin may be helpful.  Corticosteroid injections may be given by your caregiver. These injections should be reserved for the most serious  cases, because they may only be given a certain number of times. HEAT AND  COLD  Cold treatment (icing) relieves pain and reduces inflammation. Cold treatment should be applied for 10 to 15 minutes every 2 to 3 hours for inflammation and pain and immediately after any activity that aggravates your symptoms. Use ice packs or massage the area with a piece of ice (ice massage).  Heat treatment may be used prior to performing the stretching and strengthening activities prescribed by your caregiver, physical therapist, or athletic trainer. Use a heat pack or soak the injury in warm water. SEEK MEDICAL CARE IF:  Treatment seems to offer no benefit, or the condition worsens.  Any medications produce adverse side effects. EXERCISES  RANGE OF MOTION (ROM) AND STRETCHING EXERCISES - Sciatica Most people with sciatic will find that their symptoms worsen with either excessive bending forward (flexion) or arching at the low back (extension). The exercises which will help resolve your symptoms will focus on the opposite motion. Your physician, physical therapist or athletic trainer will help you determine which exercises will be most helpful to resolve your low back pain. Do not complete any exercises without first consulting with your clinician. Discontinue any exercises which worsen your symptoms until you speak to your clinician. If you have pain, numbness or tingling which travels down into your buttocks, leg or foot, the goal of the therapy is for these symptoms to move closer to your back and eventually resolve. Occasionally, these leg symptoms will get better, but your low back pain may worsen; this is typically an indication of progress in your rehabilitation. Be certain to be very alert to any changes in your symptoms and the activities in which you participated in the 24 hours prior to the change. Sharing this information with your clinician will allow him/her to most efficiently treat your condition. These exercises may help you when beginning to rehabilitate your injury. Your  symptoms may resolve with or without further involvement from your physician, physical therapist or athletic trainer. While completing these exercises, remember:   Restoring tissue flexibility helps normal motion to return to the joints. This allows healthier, less painful movement and activity.  An effective stretch should be held for at least 30 seconds.  A stretch should never be painful. You should only feel a gentle lengthening or release in the stretched tissue. FLEXION RANGE OF MOTION AND STRETCHING EXERCISES: STRETCH - Flexion, Single Knee to Chest   Lie on a firm bed or floor with both legs extended in front of you.  Keeping one leg in contact with the floor, bring your opposite knee to your chest. Hold your leg in place by either grabbing behind your thigh or at your knee.  Pull until you feel a gentle stretch in your low back. Hold __________ seconds.  Slowly release your grasp and repeat the exercise with the opposite side. Repeat __________ times. Complete this exercise __________ times per day.  STRETCH - Flexion, Double Knee to Chest  Lie on a firm bed or floor with both legs extended in front of you.  Keeping one leg in contact with the floor, bring your opposite knee to your chest.  Tense your stomach muscles to support your back and then lift your other knee to your chest. Hold your legs in place by either grabbing behind your thighs or at your knees.  Pull both knees toward your chest until you feel a gentle stretch in your low back. Hold __________ seconds.  Tense  your stomach muscles and slowly return one leg at a time to the floor. Repeat __________ times. Complete this exercise __________ times per day.  STRETCH - Low Trunk Rotation   Lie on a firm bed or floor. Keeping your legs in front of you, bend your knees so they are both pointed toward the ceiling and your feet are flat on the floor.  Extend your arms out to the side. This will stabilize your upper  body by keeping your shoulders in contact with the floor.  Gently and slowly drop both knees together to one side until you feel a gentle stretch in your low back. Hold for __________ seconds.  Tense your stomach muscles to support your low back as you bring your knees back to the starting position. Repeat the exercise to the other side. Repeat __________ times. Complete this exercise __________ times per day  EXTENSION RANGE OF MOTION AND FLEXIBILITY EXERCISES: STRETCH - Extension, Prone on Elbows  Lie on your stomach on the floor, a bed will be too soft. Place your palms about shoulder width apart and at the height of your head.  Place your elbows under your shoulders. If this is too painful, stack pillows under your chest.  Allow your body to relax so that your hips drop lower and make contact more completely with the floor.  Hold this position for __________ seconds.  Slowly return to lying flat on the floor. Repeat __________ times. Complete this exercise __________ times per day.  RANGE OF MOTION - Extension, Prone Press Ups  Lie on your stomach on the floor, a bed will be too soft. Place your palms about shoulder width apart and at the height of your head.  Keeping your back as relaxed as possible, slowly straighten your elbows while keeping your hips on the floor. You may adjust the placement of your hands to maximize your comfort. As you gain motion, your hands will come more underneath your shoulders.  Hold this position __________ seconds.  Slowly return to lying flat on the floor. Repeat __________ times. Complete this exercise __________ times per day.  STRENGTHENING EXERCISES - Sciatica  These exercises may help you when beginning to rehabilitate your injury. These exercises should be done near your "sweet spot." This is the neutral, low-back arch, somewhere between fully rounded and fully arched, that is your least painful position. When performed in this safe range of  motion, these exercises can be used for people who have either a flexion or extension based injury. These exercises may resolve your symptoms with or without further involvement from your physician, physical therapist or athletic trainer. While completing these exercises, remember:   Muscles can gain both the endurance and the strength needed for everyday activities through controlled exercises.  Complete these exercises as instructed by your physician, physical therapist or athletic trainer. Progress with the resistance and repetition exercises only as your caregiver advises.  You may experience muscle soreness or fatigue, but the pain or discomfort you are trying to eliminate should never worsen during these exercises. If this pain does worsen, stop and make certain you are following the directions exactly. If the pain is still present after adjustments, discontinue the exercise until you can discuss the trouble with your clinician. STRENGTHENING - Deep Abdominals, Pelvic Tilt   Lie on a firm bed or floor. Keeping your legs in front of you, bend your knees so they are both pointed toward the ceiling and your feet are flat on the floor.  Tense your lower abdominal muscles to press your low back into the floor. This motion will rotate your pelvis so that your tail bone is scooping upwards rather than pointing at your feet or into the floor.  With a gentle tension and even breathing, hold this position for __________ seconds. Repeat __________ times. Complete this exercise __________ times per day.  STRENGTHENING - Abdominals, Crunches   Lie on a firm bed or floor. Keeping your legs in front of you, bend your knees so they are both pointed toward the ceiling and your feet are flat on the floor. Cross your arms over your chest.  Slightly tip your chin down without bending your neck.  Tense your abdominals and slowly lift your trunk high enough to just clear your shoulder blades. Lifting higher can  put excessive stress on the low back and does not further strengthen your abdominal muscles.  Control your return to the starting position. Repeat __________ times. Complete this exercise __________ times per day.  STRENGTHENING - Quadruped, Opposite UE/LE Lift  Assume a hands and knees position on a firm surface. Keep your hands under your shoulders and your knees under your hips. You may place padding under your knees for comfort.  Find your neutral spine and gently tense your abdominal muscles so that you can maintain this position. Your shoulders and hips should form a rectangle that is parallel with the floor and is not twisted.  Keeping your trunk steady, lift your right hand no higher than your shoulder and then your left leg no higher than your hip. Make sure you are not holding your breath. Hold this position __________ seconds.  Continuing to keep your abdominal muscles tense and your back steady, slowly return to your starting position. Repeat with the opposite arm and leg. Repeat __________ times. Complete this exercise __________ times per day.  STRENGTHENING - Abdominals and Quadriceps, Straight Leg Raise   Lie on a firm bed or floor with both legs extended in front of you.  Keeping one leg in contact with the floor, bend the other knee so that your foot can rest flat on the floor.  Find your neutral spine, and tense your abdominal muscles to maintain your spinal position throughout the exercise.  Slowly lift your straight leg off the floor about 6 inches for a count of 15, making sure to not hold your breath.  Still keeping your neutral spine, slowly lower your leg all the way to the floor. Repeat this exercise with each leg __________ times. Complete this exercise __________ times per day. POSTURE AND BODY MECHANICS CONSIDERATIONS - Sciatica Keeping correct posture when sitting, standing or completing your activities will reduce the stress put on different body tissues,  allowing injured tissues a chance to heal and limiting painful experiences. The following are general guidelines for improved posture. Your physician or physical therapist will provide you with any instructions specific to your needs. While reading these guidelines, remember:  The exercises prescribed by your provider will help you have the flexibility and strength to maintain correct postures.  The correct posture provides the optimal environment for your joints to work. All of your joints have less wear and tear when properly supported by a spine with good posture. This means you will experience a healthier, less painful body.  Correct posture must be practiced with all of your activities, especially prolonged sitting and standing. Correct posture is as important when doing repetitive low-stress activities (typing) as it is when doing a single  heavy-load activity (lifting). RESTING POSITIONS Consider which positions are most painful for you when choosing a resting position. If you have pain with flexion-based activities (sitting, bending, stooping, squatting), choose a position that allows you to rest in a less flexed posture. You would want to avoid curling into a fetal position on your side. If your pain worsens with extension-based activities (prolonged standing, working overhead), avoid resting in an extended position such as sleeping on your stomach. Most people will find more comfort when they rest with their spine in a more neutral position, neither too rounded nor too arched. Lying on a non-sagging bed on your side with a pillow between your knees, or on your back with a pillow under your knees will often provide some relief. Keep in mind, being in any one position for a prolonged period of time, no matter how correct your posture, can still lead to stiffness. PROPER SITTING POSTURE In order to minimize stress and discomfort on your spine, you must sit with correct posture Sitting with good  posture should be effortless for a healthy body. Returning to good posture is a gradual process. Many people can work toward this most comfortably by using various supports until they have the flexibility and strength to maintain this posture on their own. When sitting with proper posture, your ears will fall over your shoulders and your shoulders will fall over your hips. You should use the back of the chair to support your upper back. Your low back will be in a neutral position, just slightly arched. You may place a small pillow or folded towel at the base of your low back for support.  When working at a desk, create an environment that supports good, upright posture. Without extra support, muscles fatigue and lead to excessive strain on joints and other tissues. Keep these recommendations in mind: CHAIR:   A chair should be able to slide under your desk when your back makes contact with the back of the chair. This allows you to work closely.  The chair's height should allow your eyes to be level with the upper part of your monitor and your hands to be slightly lower than your elbows. BODY POSITION  Your feet should make contact with the floor. If this is not possible, use a foot rest.  Keep your ears over your shoulders. This will reduce stress on your neck and low back. INCORRECT SITTING POSTURES   If you are feeling tired and unable to assume a healthy sitting posture, do not slouch or slump. This puts excessive strain on your back tissues, causing more damage and pain. Healthier options include:  Using more support, like a lumbar pillow.  Switching tasks to something that requires you to be upright or walking.  Talking a brief walk.  Lying down to rest in a neutral-spine position. PROLONGED STANDING WHILE SLIGHTLY LEANING FORWARD  When completing a task that requires you to lean forward while standing in one place for a long time, place either foot up on a stationary 2-4 inch high  object to help maintain the best posture. When both feet are on the ground, the low back tends to lose its slight inward curve. If this curve flattens (or becomes too large), then the back and your other joints will experience too much stress, fatigue more quickly and can cause pain.  CORRECT STANDING POSTURES Proper standing posture should be assumed with all daily activities, even if they only take a few moments, like when brushing your  teeth. As in sitting, your ears should fall over your shoulders and your shoulders should fall over your hips. You should keep a slight tension in your abdominal muscles to brace your spine. Your tailbone should point down to the ground, not behind your body, resulting in an over-extended swayback posture.  INCORRECT STANDING POSTURES  Common incorrect standing postures include a forward head, locked knees and/or an excessive swayback. WALKING Walk with an upright posture. Your ears, shoulders and hips should all line-up. PROLONGED ACTIVITY IN A FLEXED POSITION When completing a task that requires you to bend forward at your waist or lean over a low surface, try to find a way to stabilize 3 of 4 of your limbs. You can place a hand or elbow on your thigh or rest a knee on the surface you are reaching across. This will provide you more stability so that your muscles do not fatigue as quickly. By keeping your knees relaxed, or slightly bent, you will also reduce stress across your low back. CORRECT LIFTING TECHNIQUES DO :   Assume a wide stance. This will provide you more stability and the opportunity to get as close as possible to the object which you are lifting.  Tense your abdominals to brace your spine; then bend at the knees and hips. Keeping your back locked in a neutral-spine position, lift using your leg muscles. Lift with your legs, keeping your back straight.  Test the weight of unknown objects before attempting to lift them.  Try to keep your elbows  locked down at your sides in order get the best strength from your shoulders when carrying an object.  Always ask for help when lifting heavy or awkward objects. INCORRECT LIFTING TECHNIQUES DO NOT:   Lock your knees when lifting, even if it is a small object.  Bend and twist. Pivot at your feet or move your feet when needing to change directions.  Assume that you cannot safely pick up a paperclip without proper posture. Document Released: 03/06/2005 Document Revised: 07/21/2013 Document Reviewed: 06/18/2008 [facility] Patient Information 2015 [place], [place]. This information is not intended to replace advice given to you by your health care provider. Make sure you discuss any questions you have with your health care provider.

## 2014-10-09 NOTE — ED Provider Notes (Signed)
CSN: [id number]     Arrival date & time 10/09/14  1645 History   First MD Initiated Contact with Patient 10/09/14 1652     Chief Complaint  Patient presents with  . Back Pain   (Consider location/radiation/quality/duration/timing/severity/associated sxs/prior Treatment) HPI  Lumbar back pain. Patient's current episode of back pain started 2 days ago when she woke up in the morning. However, patient's back pain initially started on 08/28/2014 after she slipped and fell striking her right neck and lower back region. Pain at that time lasted approximately 4 weeks but then resolved. Current episode of back pain comes and goes, some radiation down the right leg, Tylenol Lee with minimal benefit. Denies any loss of bowel or bladder function loss of strength in the lower extremity. No further trauma to the area. She'll certain movements and when laying on that side.   Past Medical History  Diagnosis Date  . Renal stones   . Lymphoma   . Renal stones   . IBS (irritable bowel syndrome)   . Depression    Past Surgical History  Procedure Laterality Date  . Lithotripsy    . Abdominal hysterectomy    . Laparoscopy    . Lithotripsy     Family History  Problem Relation Age of Onset  . Diabetes Mother    History  Substance Use Topics  . Smoking status: Never Smoker   . Smokeless tobacco: Not on file  . Alcohol Use: No   OB History    No data available     Review of Systems Per HPI with all other pertinent systems negative.   Allergies  Review of patient's allergies indicates no known allergies.  Home Medications   Prior to Admission medications   Medication Sig Start Date End Date Taking? Authorizing Provider  citalopram (CELEXA) 40 MG tablet Take 40 mg by mouth daily.   Yes Historical Provider, MD  aspirin 325 MG tablet Take 325 mg by mouth daily.    Historical Provider, MD  Black Cohosh 20 MG TABS Take 20 mg by mouth daily.    Historical Provider, MD  calcium carbonate  (OS-CAL) 600 MG TABS tablet Take 600 mg by mouth 2 (two) times daily with a meal.    Historical Provider, MD  cetirizine (ZYRTEC) 10 MG tablet Take 1 tablet (10 mg total) by mouth daily. For allergy symptoms 06/04/14   Jacqulyn Cane, MD  Cyclobenzaprine HCl (FLEXERIL PO) Take by mouth.    Historical Provider, MD  docusate sodium (COLACE) 100 MG capsule Take 100 mg by mouth daily as needed for mild constipation.    Historical Provider, MD  ergocalciferol (VITAMIN D2) 50000 UNITS capsule Take 50,000 Units by mouth once a week. Mondays    Historical Provider, MD  estradiol (VIVELLE-DOT) 0.1 MG/24HR Place 1 patch onto the skin 2 (two) times a week. Every 4 days; per pt    Historical Provider, MD  ferrous sulfate 325 (65 FE) MG tablet Take 325 mg by mouth daily.      Historical Provider, MD  methocarbamol (ROBAXIN) 500 MG tablet Take 1 tablet (500 mg total) by mouth 2 (two) times daily. 08/29/14   Hanna Patel-Mills, PA-C  NAPROXEN PO Take by mouth.    Historical Provider, MD  nitrofurantoin, macrocrystal-monohydrate, (MACROBID) 100 MG capsule Take 1 capsule (100 mg total) by mouth 2 (two) times daily. 08/01/14   Audelia Hives Presson, PA  oxybutynin (DITROPAN) 5 MG tablet Take 1 tablet (5 mg total) by mouth 3 (three)  times daily. 07/02/14   Clayton Bibles, PA-C  oxyCODONE-acetaminophen (PERCOCET/ROXICET) 5-325 MG per tablet Take 2 tablets by mouth every 4 (four) hours as needed for severe pain. 08/29/14   Hannah Muthersbaugh, PA-C   BP 110/69 mmHg  Pulse 91  Temp(Src) 99.3 F (37.4 C) (Oral)  Resp 18  SpO2 96% Physical Exam Physical Exam  Constitutional: oriented to person, place, and time. appears well-developed and well-nourished. No distress.  HENT:  Head: Normocephalic and atraumatic.  Eyes: EOMI. PERRL.  Neck: Normal range of motion.  Cardiovascular: RRR, no m/r/g, 2+ distal pulses,  Pulmonary/Chest: Effort normal and breath sounds normal. No respiratory distress.  Abdominal: Soft. Bowel sounds  are normal. NonTTP, no distension.  Musculoskeletal: Mild right lumbar paraspinal muscle tenderness to palpation. No bony abnormality of the spine or hip.  Neurological: alert and oriented to person, place, and time.  Skin: Skin is warm. No rash noted. non diaphoretic.  Psychiatric: normal mood and affect. behavior is normal. Judgment and thought content normal.   ED Course  Procedures (including critical care time) Labs Review Labs Reviewed - No data to display  Imaging Review No results found.   MDM   1. Back strain, initial encounter   2. Sciatica, unspecified laterality    Decadron 10 mg IM given in office, patient to increase NSAID use to 2 Aleve twice a day, heat, massage, exercise handout provided area and follow-up with primary care physician or sports medicine clinic if not improving.    Waldemar Dickens, MD 10/09/14 [phone]

## 2014-10-09 NOTE — ED Notes (Signed)
Pt reports she woke up w/ back pain onset 2 days... Denies recent inj/trauma but recalls falling back in June Pain increases w/activity Steady gait... No acute distress.

## 2015-04-03 ENCOUNTER — Encounter (HOSPITAL_COMMUNITY): Payer: Self-pay | Admitting: Emergency Medicine

## 2015-04-03 ENCOUNTER — Emergency Department (HOSPITAL_COMMUNITY)
Admission: EM | Admit: 2015-04-03 | Discharge: 2015-04-03 | Disposition: A | Discharge status: Home / Self Care | Payer: Medicaid Other | Attending: Emergency Medicine | Admitting: Emergency Medicine

## 2015-04-03 DIAGNOSIS — G8929 Other chronic pain: Secondary | ICD-10-CM | POA: Diagnosis not present

## 2015-04-03 DIAGNOSIS — Z79899 Other long term (current) drug therapy: Secondary | ICD-10-CM | POA: Diagnosis not present

## 2015-04-03 DIAGNOSIS — Z87442 Personal history of urinary calculi: Secondary | ICD-10-CM | POA: Insufficient documentation

## 2015-04-03 DIAGNOSIS — R109 Unspecified abdominal pain: Secondary | ICD-10-CM | POA: Diagnosis not present

## 2015-04-03 DIAGNOSIS — Z8571 Personal history of Hodgkin lymphoma: Secondary | ICD-10-CM | POA: Insufficient documentation

## 2015-04-03 DIAGNOSIS — R1030 Lower abdominal pain, unspecified: Secondary | ICD-10-CM | POA: Diagnosis present

## 2015-04-03 DIAGNOSIS — Z8719 Personal history of other diseases of the digestive system: Secondary | ICD-10-CM | POA: Diagnosis not present

## 2015-04-03 DIAGNOSIS — Z90710 Acquired absence of both cervix and uterus: Secondary | ICD-10-CM | POA: Insufficient documentation

## 2015-04-03 DIAGNOSIS — R112 Nausea with vomiting, unspecified: Secondary | ICD-10-CM | POA: Diagnosis not present

## 2015-04-03 DIAGNOSIS — F329 Major depressive disorder, single episode, unspecified: Secondary | ICD-10-CM | POA: Insufficient documentation

## 2015-04-03 LAB — COMPREHENSIVE METABOLIC PANEL
ALT: 59 U/L — ABNORMAL HIGH (ref 14–54)
AST: 52 U/L — ABNORMAL HIGH (ref 15–41)
Albumin: 4.4 g/dL (ref 3.5–5.0)
Alkaline Phosphatase: 45 U/L (ref 38–126)
Anion gap: 10 (ref 5–15)
BUN: 12 mg/dL (ref 6–20)
CALCIUM: 9.6 mg/dL (ref 8.9–10.3)
CO2: 29 mmol/L (ref 22–32)
Chloride: 106 mmol/L (ref 101–111)
Creatinine, Ser: 0.9 mg/dL (ref 0.44–1.00)
GFR calc non Af Amer: >60 mL/min (ref >60)
Glucose, Bld: 105 mg/dL — ABNORMAL HIGH (ref 65–99)
POTASSIUM: 4.2 mmol/L (ref 3.5–5.1)
Sodium: 145 mmol/L (ref 135–145)
Total Bilirubin: 0.5 mg/dL (ref 0.3–1.2)
Total Protein: 7.8 g/dL (ref 6.5–8.1)

## 2015-04-03 LAB — LIPASE, BLOOD: LIPASE: 35 U/L (ref 11–51)

## 2015-04-03 LAB — CBC
HEMATOCRIT: 38.9 % (ref 36.0–46.0)
HEMOGLOBIN: 11.8 g/dL — AB (ref 12.0–15.0)
MCH: 26.4 pg (ref 26.0–34.0)
MCHC: 30.3 g/dL (ref 30.0–36.0)
MCV: 87 fL (ref 78.0–100.0)
Platelets: 158 10*3/uL (ref 150–400)
RBC: 4.47 MIL/uL (ref 3.87–5.11)
RDW: 14.2 % (ref 11.5–15.5)
WBC: 7.1 10*3/uL (ref 4.0–10.5)

## 2015-04-03 LAB — URINALYSIS, ROUTINE W REFLEX MICROSCOPIC
BILIRUBIN URINE: NEGATIVE
Glucose, UA: NEGATIVE mg/dL
Hgb urine dipstick: NEGATIVE
Ketones, ur: NEGATIVE mg/dL
Leukocytes, UA: NEGATIVE
NITRITE: NEGATIVE
PH: 7.5 (ref 5.0–8.0)
Protein, ur: NEGATIVE mg/dL
SPECIFIC GRAVITY, URINE: 1.026 (ref 1.005–1.030)

## 2015-04-03 MED ORDER — HYDROCODONE-ACETAMINOPHEN 5-325 MG PO TABS
2.0000 | ORAL_TABLET | Freq: Once | ORAL | Status: AC
  Administered 2015-04-03: 2 via ORAL
  Filled 2015-04-03: qty 2

## 2015-04-03 MED ORDER — HYDROCODONE-ACETAMINOPHEN 5-325 MG PO TABS: 1.0000 | ORAL_TABLET | Freq: Four times a day (QID) | ORAL | Status: DC | PRN

## 2015-04-03 MED ORDER — ONDANSETRON HCL 4 MG PO TABS: 4.0000 mg | ORAL_TABLET | Freq: Four times a day (QID) | ORAL | Status: DC

## 2015-04-03 NOTE — ED Provider Notes (Signed)
CSN: [id number]     Arrival date & time 04/03/15  1511 History   First MD Initiated Contact with Patient 04/03/15 1655     Chief Complaint  Patient presents with  . Abdominal Pain     (Consider location/radiation/quality/duration/timing/severity/associated sxs/prior Treatment) HPI Comments: Patient presents to the ED with a chief complaint of lower abdominal pain "over her bladder."  She states that she has had this problem before.  She states that this episode started last night.  She rates her pain as an 8/10.  The pain does not radiate.  She denies any fevers, chills, flank pain, dysuria, or vaginal discharge.  She has had one episode of nausea and vomiting.  She reports a history of kidney stones, but states that this does not feel the same.  She states that it feels more like endometriosis.  She states that she has had a hysterectomy.  She has not tried taking anything to alleviate her symptoms.  The history is provided by the patient. No language interpreter was used.    Past Medical History  Diagnosis Date  . Renal stones   . Lymphoma ([place])   . Renal stones   . IBS (irritable bowel syndrome)   . Depression    Past Surgical History  Procedure Laterality Date  . Lithotripsy    . Abdominal hysterectomy    . Laparoscopy    . Lithotripsy     Family History  Problem Relation Age of Onset  . Diabetes Mother    Social History  Substance Use Topics  . Smoking status: Never Smoker   . Smokeless tobacco: None  . Alcohol Use: No   OB History    No data available     Review of Systems  Constitutional: Negative for fever and chills.  Respiratory: Negative for shortness of breath.   Cardiovascular: Negative for chest pain.  Gastrointestinal: Positive for abdominal pain. Negative for nausea, vomiting, diarrhea and constipation.  Genitourinary: Negative for dysuria.  All other systems reviewed and are negative.     Allergies  Review of patient's allergies indicates no  known allergies.  Home Medications   Prior to Admission medications   Medication Sig Start Date End Date Taking? Authorizing Provider  citalopram (CELEXA) 40 MG tablet Take 40 mg by mouth daily.   Yes Historical Provider, MD  ergocalciferol (VITAMIN D2) 50000 UNITS capsule Take 50,000 Units by mouth once a week. Mondays   Yes Historical Provider, MD  estrogens, conjugated, (PREMARIN) 0.625 MG tablet Take 0.625 mg by mouth daily.  11/03/14 11/03/15 Yes Historical Provider, MD  ferrous sulfate 325 (65 FE) MG tablet Take 325 mg by mouth daily.     Yes Historical Provider, MD  cetirizine (ZYRTEC) 10 MG tablet Take 1 tablet (10 mg total) by mouth daily. For allergy symptoms Patient not taking: Reported on 04/03/2015 06/04/14   Jacqulyn Cane, MD  methocarbamol (ROBAXIN) 500 MG tablet Take 1 tablet (500 mg total) by mouth 2 (two) times daily. Patient not taking: Reported on 04/03/2015 08/29/14   Ottie Glazier, PA-C  nitrofurantoin, macrocrystal-monohydrate, (MACROBID) 100 MG capsule Take 1 capsule (100 mg total) by mouth 2 (two) times daily. Patient not taking: Reported on 04/03/2015 08/01/14   Lutricia Feil, PA  oxybutynin (DITROPAN) 5 MG tablet Take 1 tablet (5 mg total) by mouth 3 (three) times daily. Patient not taking: Reported on 04/03/2015 07/02/14   Clayton Bibles, PA-C  oxyCODONE-acetaminophen (PERCOCET/ROXICET) 5-325 MG per tablet Take 2 tablets by mouth every  4 (four) hours as needed for severe pain. Patient not taking: Reported on 04/03/2015 08/29/14   Jarrett Soho Muthersbaugh, PA-C   BP 118/74 mmHg  Pulse 70  Temp(Src) 98 F (36.7 C) (Oral)  Resp 18  SpO2 100% Physical Exam  Constitutional: She is oriented to person, place, and time. She appears well-developed and well-nourished.  HENT:  Head: Normocephalic and atraumatic.  Eyes: Conjunctivae and EOM are normal. Pupils are equal, round, and reactive to light.  Neck: Normal range of motion. Neck supple.  Cardiovascular: Normal rate and  regular rhythm.  Exam reveals no gallop and no friction rub.   No murmur heard. Pulmonary/Chest: Effort normal and breath sounds normal. No respiratory distress. She has no wheezes. She has no rales. She exhibits no tenderness.  Abdominal: Soft. Bowel sounds are normal. She exhibits no distension and no mass. There is tenderness. There is no rebound and no guarding.  MIld focal abdominal tenderness over bladder, no RLQ tenderness or pain at McBurney's point, no RUQ tenderness or Murphy's sign, no left-sided abdominal tenderness, no fluid wave, or signs of peritonitis   Musculoskeletal: Normal range of motion. She exhibits no edema or tenderness.  Neurological: She is alert and oriented to person, place, and time.  Skin: Skin is warm and dry.  Psychiatric: She has a normal mood and affect. Her behavior is normal. Judgment and thought content normal.  Nursing note and vitals reviewed.   ED Course  Procedures (including critical care time) Results for orders placed or performed during the hospital encounter of 04/03/15  Lipase, blood  Result Value Ref Range   Lipase 35 11 - 51 U/L  Comprehensive metabolic panel  Result Value Ref Range   Sodium 145 135 - 145 mmol/L   Potassium 4.2 3.5 - 5.1 mmol/L   Chloride 106 101 - 111 mmol/L   CO2 29 22 - 32 mmol/L   Glucose, Bld 105 (H) 65 - 99 mg/dL   BUN 12 6 - 20 mg/dL   Creatinine, Ser 0.90 0.44 - 1.00 mg/dL   Calcium 9.6 8.9 - 10.3 mg/dL   Total Protein 7.8 6.5 - 8.1 g/dL   Albumin 4.4 3.5 - 5.0 g/dL   AST 52 (H) 15 - 41 U/L   ALT 59 (H) 14 - 54 U/L   Alkaline Phosphatase 45 38 - 126 U/L   Total Bilirubin 0.5 0.3 - 1.2 mg/dL   GFR calc non Af Amer >60 >60 mL/min   GFR calc Af Amer >60 >60 mL/min   Anion gap 10 5 - 15  CBC  Result Value Ref Range   WBC 7.1 4.0 - 10.5 K/uL   RBC 4.47 3.87 - 5.11 MIL/uL   Hemoglobin 11.8 (L) 12.0 - 15.0 g/dL   HCT 38.9 36.0 - 46.0 %   MCV 87.0 78.0 - 100.0 fL   MCH 26.4 26.0 - 34.0 pg   MCHC 30.3 30.0  - 36.0 g/dL   RDW 14.2 11.5 - 15.5 %   Platelets 158 150 - 400 K/uL  Urinalysis, Routine w reflex microscopic (not at [facility])  Result Value Ref Range   Color, Urine YELLOW YELLOW   APPearance CLEAR CLEAR   Specific Gravity, Urine 1.026 1.005 - 1.030   pH 7.5 5.0 - 8.0   Glucose, UA NEGATIVE NEGATIVE mg/dL   Hgb urine dipstick NEGATIVE NEGATIVE   Bilirubin Urine NEGATIVE NEGATIVE   Ketones, ur NEGATIVE NEGATIVE mg/dL   Protein, ur NEGATIVE NEGATIVE mg/dL   Nitrite NEGATIVE NEGATIVE  Leukocytes, UA NEGATIVE NEGATIVE   No results found.  I have personally reviewed and evaluated these images and lab results as part of my medical decision-making.   MDM   Final diagnoses:  Abdominal pain, unspecified abdominal location   Patient with acute on chronic abdominal pain.  Has a history of IBS, KS, and endometriosis.  States that this feels like endometriosis. Labs, treat pain, and reassess.  Lipase is normal, doubt pancreatitis, mild elevation of AST and ALT, but no right upper quadrant tenderness, doubt gallbladder disease. CBC is largely unremarkable, no leukocytosis.  Patient has not had any vaginal discharge or dysuria. Urinalysis still pending.   UA negative.  Patient's exam is not concerning for acute or surgical abdomen. She is generally well-appearing. Her vital signs are stable. I suspect that the patient can be discharged to home with close follow-up. Specific return precautions have been given. Patient understands and agrees with the plan. She is stable and ready for discharge.      Montine Circle, PA-C 04/03/15 1811  Tanna Furry, MD 04/07/15 [phone]

## 2015-04-03 NOTE — Discharge Instructions (Signed)
Return for:  New or worsening symptoms, including, increased abdominal pain, especially pain that localizes to one side, bloody vomit, bloody diarrhea, fever >101, and intractable vomiting.  Abdominal Pain, Adult Many things can cause abdominal pain. Usually, abdominal pain is not caused by a disease and will improve without treatment. It can often be observed and treated at home. Your health care provider will do a physical exam and possibly order blood tests and X-rays to help determine the seriousness of your pain. However, in many cases, more time must pass before a clear cause of the pain can be found. Before that point, your health care provider may not know if you need more testing or further treatment. HOME CARE INSTRUCTIONS Monitor your abdominal pain for any changes. The following actions may help to alleviate any discomfort you are experiencing:  Only take over-the-counter or prescription medicines as directed by your health care provider.  Do not take laxatives unless directed to do so by your health care provider.  Try a clear liquid diet (broth, tea, or water) as directed by your health care provider. Slowly move to a bland diet as tolerated. SEEK MEDICAL CARE IF:  You have unexplained abdominal pain.  You have abdominal pain associated with nausea or diarrhea.  You have pain when you urinate or have a bowel movement.  You experience abdominal pain that wakes you in the night.  You have abdominal pain that is worsened or improved by eating food.  You have abdominal pain that is worsened with eating fatty foods.  You have a fever. SEEK IMMEDIATE MEDICAL CARE IF:  Your pain does not go away within 2 hours.  You keep throwing up (vomiting).  Your pain is felt only in portions of the abdomen, such as the right side or the left lower portion of the abdomen.  You pass bloody or black tarry stools. MAKE SURE YOU:  Understand these instructions.  Will watch your  condition.  Will get help right away if you are not doing well or get worse.   This information is not intended to replace advice given to you by your health care provider. Make sure you discuss any questions you have with your health care provider.   Document Released: 12/14/2004 Document Revised: 11/25/2014 Document Reviewed: 11/13/2012 Elsevier Interactive Patient Education [employer].

## 2015-04-03 NOTE — ED Notes (Signed)
Pt states that she has been having low abd pain "like near my bladder" since last night.  Denies dysuria, denies frequency, denies vaginal discharge.  States she vomited last night.

## 2015-07-29 ENCOUNTER — Emergency Department (HOSPITAL_COMMUNITY): Payer: Medicaid Other

## 2015-07-29 ENCOUNTER — Emergency Department (HOSPITAL_COMMUNITY)
Admission: EM | Admit: 2015-07-29 | Discharge: 2015-07-29 | Disposition: A | Discharge status: Home / Self Care | Payer: Medicaid Other | Attending: Emergency Medicine | Admitting: Emergency Medicine

## 2015-07-29 ENCOUNTER — Encounter (HOSPITAL_COMMUNITY): Payer: Self-pay | Admitting: Nurse Practitioner

## 2015-07-29 DIAGNOSIS — Z79899 Other long term (current) drug therapy: Secondary | ICD-10-CM | POA: Diagnosis not present

## 2015-07-29 DIAGNOSIS — Z8572 Personal history of non-Hodgkin lymphomas: Secondary | ICD-10-CM | POA: Diagnosis not present

## 2015-07-29 DIAGNOSIS — R05 Cough: Secondary | ICD-10-CM | POA: Diagnosis not present

## 2015-07-29 DIAGNOSIS — J3489 Other specified disorders of nose and nasal sinuses: Secondary | ICD-10-CM | POA: Insufficient documentation

## 2015-07-29 DIAGNOSIS — Z8719 Personal history of other diseases of the digestive system: Secondary | ICD-10-CM | POA: Insufficient documentation

## 2015-07-29 DIAGNOSIS — M542 Cervicalgia: Secondary | ICD-10-CM | POA: Diagnosis not present

## 2015-07-29 DIAGNOSIS — F329 Major depressive disorder, single episode, unspecified: Secondary | ICD-10-CM | POA: Diagnosis not present

## 2015-07-29 DIAGNOSIS — Z79818 Long term (current) use of other agents affecting estrogen receptors and estrogen levels: Secondary | ICD-10-CM | POA: Insufficient documentation

## 2015-07-29 DIAGNOSIS — R221 Localized swelling, mass and lump, neck: Secondary | ICD-10-CM | POA: Insufficient documentation

## 2015-07-29 DIAGNOSIS — Z87442 Personal history of urinary calculi: Secondary | ICD-10-CM | POA: Diagnosis not present

## 2015-07-29 DIAGNOSIS — R0602 Shortness of breath: Secondary | ICD-10-CM | POA: Insufficient documentation

## 2015-07-29 DIAGNOSIS — R053 Chronic cough: Secondary | ICD-10-CM

## 2015-07-29 LAB — CBC WITH DIFFERENTIAL/PLATELET
BASOS ABS: 0 10*3/uL (ref 0.0–0.1)
BASOS PCT: 0 %
Eosinophils Absolute: 0.1 10*3/uL (ref 0.0–0.7)
Eosinophils Relative: 3 %
HEMATOCRIT: 36.2 % (ref 36.0–46.0)
Hemoglobin: 11.5 g/dL — ABNORMAL LOW (ref 12.0–15.0)
LYMPHS PCT: 44 %
Lymphs Abs: 2.3 10*3/uL (ref 0.7–4.0)
MCH: 26.9 pg (ref 26.0–34.0)
MCHC: 31.8 g/dL (ref 30.0–36.0)
MCV: 84.8 fL (ref 78.0–100.0)
Monocytes Absolute: 0.3 10*3/uL (ref 0.1–1.0)
Monocytes Relative: 6 %
NEUTROS ABS: 2.5 10*3/uL (ref 1.7–7.7)
NEUTROS PCT: 47 %
PLATELETS: 189 10*3/uL (ref 150–400)
RBC: 4.27 MIL/uL (ref 3.87–5.11)
RDW: 14 % (ref 11.5–15.5)
WBC: 5.3 10*3/uL (ref 4.0–10.5)

## 2015-07-29 LAB — I-STAT CHEM 8, ED
BUN: 7 mg/dL (ref 6–20)
CREATININE: 0.9 mg/dL (ref 0.44–1.00)
Calcium, Ion: 1.19 mmol/L (ref 1.12–1.23)
Chloride: 102 mmol/L (ref 101–111)
GLUCOSE: 101 mg/dL — AB (ref 65–99)
HCT: 38 % (ref 36.0–46.0)
HEMOGLOBIN: 12.9 g/dL (ref 12.0–15.0)
Potassium: 4 mmol/L (ref 3.5–5.1)
Sodium: 142 mmol/L (ref 135–145)
TCO2: 28 mmol/L (ref 0–100)

## 2015-07-29 LAB — D-DIMER, QUANTITATIVE: D-Dimer, Quant: 0.34 ug/mL-FEU (ref 0.00–0.50)

## 2015-07-29 MED ORDER — BENZONATATE 100 MG PO CAPS: 100.0000 mg | ORAL_CAPSULE | Freq: Three times a day (TID) | ORAL | Status: DC | PRN

## 2015-07-29 NOTE — ED Provider Notes (Signed)
CSN: [id number]     Arrival date & time 07/29/15  1450 History  By signing my name below, I, Victoria Ali, attest that this documentation has been prepared under the direction and in the presence of Victoria Moras, PA-C. Electronically Signed: Judithann Sauger, ED Scribe. 07/29/2015. 3:53 PM.    Chief Complaint  Patient presents with  . Cough   The history is provided by the patient. No language interpreter was used.   HPI Comments: Victoria Ali is a 45 y.o. female with a hx of Hodgkin's Lymphoma (2011) who presents to the Emergency Department complaining of gradually worsening persistent productive cough with yellow/white sputum onset 3-4 months ago. She reports associated neck swelling and rhinorrhea. She adds that she had CP last night that is worse when she lays down and better when she sits up. Pt states that she has been visiting her PCP since onset and given Tamiflu with no relief. No other alleviating factors noted. She denies that she is a current smoker. She reports a sick contact at home with her grandson who was diagnosed with pneumonia. She denies any conclusive thyroid issues but states that she has had increased fatigue and being more emotional. Pt denies any fever.   Pt also complains of sharp pain in her bilateral knees and left upper leg pain (with "lump" to the lateral aspect) onset one week ago. She reports associated SOB. No alleviating factors noted. Pt has not tried any medications for this PTA. She states that she went to the beach in March but no other long trips or any recent surgeries.   PCP: Dr. Su Monks.    Past Medical History  Diagnosis Date  . Renal stones   . Lymphoma ([place])   . Renal stones   . IBS (irritable bowel syndrome)   . Depression    Past Surgical History  Procedure Laterality Date  . Lithotripsy    . Abdominal hysterectomy    . Laparoscopy    . Lithotripsy     Family History  Problem Relation Age of Onset  . Diabetes Mother    Social  History  Substance Use Topics  . Smoking status: Never Smoker   . Smokeless tobacco: None  . Alcohol Use: No   OB History    No data available     Review of Systems  Constitutional: Negative for fever.  HENT: Positive for rhinorrhea.   Respiratory: Positive for cough and shortness of breath.   Musculoskeletal: Positive for arthralgias and neck pain (neck swelling).  All other systems reviewed and are negative.     Allergies  Review of patient's allergies indicates no known allergies.  Home Medications   Prior to Admission medications   Medication Sig Start Date End Date Taking? Authorizing Provider  cetirizine (ZYRTEC) 10 MG tablet Take 1 tablet (10 mg total) by mouth daily. For allergy symptoms Patient not taking: Reported on 04/03/2015 06/04/14   Jacqulyn Cane, MD  citalopram (CELEXA) 40 MG tablet Take 40 mg by mouth daily.    Historical Provider, MD  ergocalciferol (VITAMIN D2) 50000 UNITS capsule Take 50,000 Units by mouth once a week. Mondays    Historical Provider, MD  estrogens, conjugated, (PREMARIN) 0.625 MG tablet Take 0.625 mg by mouth daily.  11/03/14 11/03/15  Historical Provider, MD  ferrous sulfate 325 (65 FE) MG tablet Take 325 mg by mouth daily.      Historical Provider, MD  HYDROcodone-acetaminophen (NORCO/VICODIN) 5-325 MG tablet Take 1-2 tablets by mouth every 6 (six)  hours as needed. 04/03/15   Montine Circle, PA-C  methocarbamol (ROBAXIN) 500 MG tablet Take 1 tablet (500 mg total) by mouth 2 (two) times daily. Patient not taking: Reported on 04/03/2015 08/29/14   Ottie Glazier, PA-C  nitrofurantoin, macrocrystal-monohydrate, (MACROBID) 100 MG capsule Take 1 capsule (100 mg total) by mouth 2 (two) times daily. Patient not taking: Reported on 04/03/2015 08/01/14   Audelia Hives Presson, PA  ondansetron (ZOFRAN) 4 MG tablet Take 1 tablet (4 mg total) by mouth every 6 (six) hours. 04/03/15   Montine Circle, PA-C  oxybutynin (DITROPAN) 5 MG tablet Take 1 tablet (5  mg total) by mouth 3 (three) times daily. Patient not taking: Reported on 04/03/2015 07/02/14   Clayton Bibles, PA-C  oxyCODONE-acetaminophen (PERCOCET/ROXICET) 5-325 MG per tablet Take 2 tablets by mouth every 4 (four) hours as needed for severe pain. Patient not taking: Reported on 04/03/2015 08/29/14   Jarrett Soho Muthersbaugh, PA-C   BP 131/77 mmHg  Pulse 75  Temp(Src) 98.1 F (36.7 C) (Oral)  Resp 18  SpO2 98% Physical Exam  Constitutional: She is oriented to person, place, and time. She appears well-developed and well-nourished.  HENT:  Head: Normocephalic and atraumatic.  Neck: No thyromegaly present.  Cardiovascular: Normal rate and regular rhythm.  Exam reveals no gallop and no friction rub.   No murmur heard. Pulmonary/Chest: Effort normal and breath sounds normal. She has no wheezes. She has no rales.  Musculoskeletal:  BLE: no erythema or edema, mild discomfort to lateral fibula-tibia region without any overlying skin changes.   Neurological: She is alert and oriented to person, place, and time.  Skin: Skin is warm and dry.  Psychiatric: She has a normal mood and affect.  Nursing note and vitals reviewed.   ED Course  Procedures (including critical care time) DIAGNOSTIC STUDIES: Oxygen Saturation is 98% on RA, normal by my interpretation.    COORDINATION OF CARE: 3:42 PM- Pt advised of plan for treatment and pt agrees. Pt will receive a D-dimer for further evaluation. Explained that suspicion for PE/DVT is low but pt opts for a D-dimer. She will receive a EKG, lab work, and chest x-ray for further evaluation.     Labs Review Labs Reviewed  CBC WITH DIFFERENTIAL/PLATELET - Abnormal; Notable for the following:    Hemoglobin 11.5 (*)    All other components within normal limits  I-STAT CHEM 8, ED - Abnormal; Notable for the following:    Glucose, Bld 101 (*)    All other components within normal limits  D-DIMER, QUANTITATIVE (NOT AT [facility])    Imaging Review Dg Chest 2  View  07/29/2015  CLINICAL DATA:  Cough for 3 months with shortness of breath and chest pressure since last night. History of lymphoma. EXAM: CHEST  2 VIEW COMPARISON:  02/09/2009 and 12/13/2007. FINDINGS: The heart size and mediastinal contours are normal. The lungs are clear. There is no pleural effusion or pneumothorax. No acute osseous findings are identified. IMPRESSION: Stable chest.  No active cardiopulmonary process. Electronically Signed   By: Richardean Sale M.D.   On: 07/29/2015 15:59     Victoria Moras, PA-C has personally reviewed and evaluated these images and lab results as part of his  medical decision-making.   EKG Interpretation   Date/Time:  Thursday Jul 29 2015 16:03:37 EDT Ventricular Rate:  64 PR Interval:  192 QRS Duration: 90 QT Interval:  418 QTC Calculation: 431 R Axis:   45 Text Interpretation:  Normal sinus rhythm Normal ECG No  old tracing to  compare Confirmed by [place] MD, [place] [phone]) on 07/29/2015 4:18:00 PM      MDM   Final diagnoses:  Chronic cough    BP 131/77 mmHg  Pulse 75  Temp(Src) 98.1 F (36.7 C) (Oral)  Resp 18  SpO2 98%  I personally performed the services described in this documentation, which was scribed in my presence. The recorded information has been reviewed and is accurate.     4:55 PM Pt here with viral respiratory complaint which has been ongoing for several months.  She has prior hx of lymphoma as well as being on oral hormone which may increase the risk of blood clots.  Fortunately her d-dimer is negative, therefore low suspicion for pe/dvt.  cxr without acute abnormality, ekg without acute ischemic changes or signs to suggest pericarditis.  Labs are reassuring.  Pt currently not taking any ACEi.  Although she may have possible thyroid disease based on some of her sxs, i encourage pt to f/u with her PCP for further care as she does not exhibits sxs concerning for thyroid storm or myedema coma.  Will provide sxs treatment and  return precaution discussed.    Victoria Moras, PA-C 07/29/15 [place] Liu, MD 07/29/15 2010

## 2015-07-29 NOTE — Discharge Instructions (Signed)

## 2015-07-29 NOTE — ED Notes (Addendum)
Pt c/o several day history of painful cough that makes her feel SOB. shes also had chills, sweats. Her grandson was recently dx with pneumonia. She is alert and breathing easily.

## 2015-10-09 ENCOUNTER — Emergency Department (HOSPITAL_COMMUNITY)
Admission: EM | Admit: 2015-10-09 | Discharge: 2015-10-09 | Disposition: A | Discharge status: Home / Self Care | Payer: Medicaid Other | Attending: Emergency Medicine | Admitting: Emergency Medicine

## 2015-10-09 ENCOUNTER — Encounter (HOSPITAL_COMMUNITY): Payer: Self-pay

## 2015-10-09 ENCOUNTER — Emergency Department (HOSPITAL_COMMUNITY): Payer: Medicaid Other

## 2015-10-09 DIAGNOSIS — Z8572 Personal history of non-Hodgkin lymphomas: Secondary | ICD-10-CM | POA: Diagnosis not present

## 2015-10-09 DIAGNOSIS — R042 Hemoptysis: Secondary | ICD-10-CM | POA: Insufficient documentation

## 2015-10-09 LAB — COMPREHENSIVE METABOLIC PANEL
ALT: 27 U/L (ref 14–54)
ANION GAP: 4 — AB (ref 5–15)
AST: 29 U/L (ref 15–41)
Albumin: 4 g/dL (ref 3.5–5.0)
Alkaline Phosphatase: 36 U/L — ABNORMAL LOW (ref 38–126)
BUN: 9 mg/dL (ref 6–20)
CALCIUM: 8.9 mg/dL (ref 8.9–10.3)
CO2: 24 mmol/L (ref 22–32)
CREATININE: 0.87 mg/dL (ref 0.44–1.00)
Chloride: 110 mmol/L (ref 101–111)
Glucose, Bld: 130 mg/dL — ABNORMAL HIGH (ref 65–99)
Potassium: 3.6 mmol/L (ref 3.5–5.1)
Sodium: 138 mmol/L (ref 135–145)
TOTAL PROTEIN: 6.4 g/dL — AB (ref 6.5–8.1)
Total Bilirubin: 0.5 mg/dL (ref 0.3–1.2)

## 2015-10-09 LAB — CBC
HCT: 35.8 % — ABNORMAL LOW (ref 36.0–46.0)
Hemoglobin: 11.1 g/dL — ABNORMAL LOW (ref 12.0–15.0)
MCH: 26.4 pg (ref 26.0–34.0)
MCHC: 31 g/dL (ref 30.0–36.0)
MCV: 85 fL (ref 78.0–100.0)
Platelets: 166 10*3/uL (ref 150–400)
RBC: 4.21 MIL/uL (ref 3.87–5.11)
RDW: 13.6 % (ref 11.5–15.5)
WBC: 4.8 10*3/uL (ref 4.0–10.5)

## 2015-10-09 MED ORDER — PREDNISONE 20 MG PO TABS
60.0000 mg | ORAL_TABLET | Freq: Once | ORAL | Status: AC
  Administered 2015-10-09: 60 mg via ORAL
  Filled 2015-10-09: qty 3

## 2015-10-09 MED ORDER — PREDNISONE 20 MG PO TABS: ORAL_TABLET | ORAL | Status: DC

## 2015-10-09 MED ORDER — LORATADINE 10 MG PO TABS: 10.0000 mg | ORAL_TABLET | Freq: Every day | ORAL | Status: DC

## 2015-10-09 NOTE — ED Provider Notes (Addendum)
CSN: [id number]     Arrival date & time 10/09/15  1055 History   First MD Initiated Contact with Patient 10/09/15 1423     Chief Complaint  Patient presents with  . Cough  . Hemoptysis     (Consider location/radiation/quality/duration/timing/severity/associated sxs/prior Treatment)  Is a 45 year old female with a past history of lymphoma who presents to the emergency department with bloody sputum. Patient states that she has had intermittent episodes of coughing for the last year however this episode has been going on for a few weeks and had been improving since when she coughed and noticed blood instead of just normal sputum. Has not had any since then. At this time is not having shortness of breath, chest pain, back pain, flank pain. She does not endorse any weight loss, fevers, chills, night sweats or other B symptoms.      Past Medical History  Diagnosis Date  . Renal stones   . Lymphoma ([place])   . Renal stones   . IBS (irritable bowel syndrome)   . Depression    Past Surgical History  Procedure Laterality Date  . Lithotripsy    . Abdominal hysterectomy    . Laparoscopy    . Lithotripsy     Family History  Problem Relation Age of Onset  . Diabetes Mother    Social History  Substance Use Topics  . Smoking status: Never Smoker   . Smokeless tobacco: None  . Alcohol Use: No   OB History    No data available     Review of Systems  Respiratory: Positive for cough.   All other systems reviewed and are negative.     Allergies  Review of patient's allergies indicates no known allergies.  Home Medications   Prior to Admission medications   Medication Sig Start Date End Date Taking? Authorizing Provider  benzonatate (TESSALON) 100 MG capsule Take 1 capsule (100 mg total) by mouth 3 (three) times daily as needed for cough. 07/29/15   Domenic Moras, PA-C  cetirizine (ZYRTEC) 10 MG tablet Take 1 tablet (10 mg total) by mouth daily. For allergy symptoms Patient not  taking: Reported on 04/03/2015 06/04/14   Jacqulyn Cane, MD  citalopram (CELEXA) 40 MG tablet Take 40 mg by mouth daily.    Historical Provider, MD  ergocalciferol (VITAMIN D2) 50000 UNITS capsule Take 50,000 Units by mouth once a week. Mondays    Historical Provider, MD  estrogens, conjugated, (PREMARIN) 0.625 MG tablet Take 0.625 mg by mouth daily.  11/03/14 11/03/15  Historical Provider, MD  ferrous sulfate 325 (65 FE) MG tablet Take 325 mg by mouth daily.      Historical Provider, MD  HYDROcodone-acetaminophen (NORCO/VICODIN) 5-325 MG tablet Take 1-2 tablets by mouth every 6 (six) hours as needed. 04/03/15   Montine Circle, PA-C  methocarbamol (ROBAXIN) 500 MG tablet Take 1 tablet (500 mg total) by mouth 2 (two) times daily. Patient not taking: Reported on 04/03/2015 08/29/14   Ottie Glazier, PA-C  nitrofurantoin, macrocrystal-monohydrate, (MACROBID) 100 MG capsule Take 1 capsule (100 mg total) by mouth 2 (two) times daily. Patient not taking: Reported on 04/03/2015 08/01/14   Audelia Hives Presson, PA  ondansetron (ZOFRAN) 4 MG tablet Take 1 tablet (4 mg total) by mouth every 6 (six) hours. 04/03/15   Montine Circle, PA-C  oxybutynin (DITROPAN) 5 MG tablet Take 1 tablet (5 mg total) by mouth 3 (three) times daily. Patient not taking: Reported on 04/03/2015 07/02/14   Clayton Bibles, PA-C  oxyCODONE-acetaminophen (  PERCOCET/ROXICET) 5-325 MG per tablet Take 2 tablets by mouth every 4 (four) hours as needed for severe pain. Patient not taking: Reported on 04/03/2015 08/29/14   Jarrett Soho Muthersbaugh, PA-C   BP 120/79 mmHg  Pulse 64  Temp(Src) 98.2 F (36.8 C) (Oral)  Resp 18  Ht 5\' 6"  (1.676 m)  Wt 175 lb 9.6 oz (79.652 kg)  BMI 28.36 kg/m2  SpO2 100% Physical Exam  Constitutional: She is oriented to person, place, and time. She appears well-developed and well-nourished.  HENT:  Head: Normocephalic and atraumatic.  Eyes: Conjunctivae are normal. Pupils are equal, round, and reactive to light.  Neck:  Normal range of motion.  Cardiovascular: Normal rate and regular rhythm.  Exam reveals no friction rub.   No murmur heard. Pulmonary/Chest: Effort normal. No stridor. No respiratory distress. She has no wheezes. She has no rales.  Abdominal: Soft. She exhibits no distension. There is no tenderness.  Musculoskeletal: Normal range of motion. She exhibits no edema.  Neurological: She is alert and oriented to person, place, and time. No cranial nerve deficit. Coordination normal.  Skin: Skin is warm and dry. No rash noted. No erythema.  Nursing note and vitals reviewed.   ED Course  Procedures (including critical care time) Labs Review Labs Reviewed  COMPREHENSIVE METABOLIC PANEL - Abnormal; Notable for the following:    Glucose, Bld 130 (*)    Total Protein 6.4 (*)    Alkaline Phosphatase 36 (*)    Anion gap 4 (*)    All other components within normal limits  CBC - Abnormal; Notable for the following:    Hemoglobin 11.1 (*)    HCT 35.8 (*)    All other components within normal limits    Imaging Review Dg Chest 2 View  10/09/2015  CLINICAL DATA:  Cough EXAM: CHEST  2 VIEW COMPARISON:  Jul 29, 2015 FINDINGS: The heart size and mediastinal contours are within normal limits. Both lungs are clear. The visualized skeletal structures are unremarkable. IMPRESSION: No active cardiopulmonary disease. Electronically Signed   By: Dorise Bullion III M.D   On: 10/09/2015 13:39   I have personally reviewed and evaluated these images and lab results as part of my medical decision-making.   EKG Interpretation None      MDM   Final diagnoses:  None   Bloody sputum after a year of intermtitent coughing, most recently fora  Few weeks buthas been improving. PERC negative. No fever, systemic symptoms to suggest pneumonia, neg CXR. Likely bronchitis. Less likely, autoimmune diseases or cancer however she does have a history of lymphoma so will follow-up with her oncologist if his symptoms continue.  If they get worse she will return here for further workup.  New Prescriptions: Discharge Medication List as of 10/09/2015  4:00 PM    START taking these medications   Details  loratadine (CLARITIN) 10 MG tablet Take 1 tablet (10 mg total) by mouth daily. One po daily x 5 days, Starting 10/09/2015, Until Discontinued, Print    predniSONE (DELTASONE) 20 MG tablet 3 tabs po daily x 3 days, then 2 tabs x 3 days, then 1.5 tabs x 3 days, then 1 tab x 3 days, then 0.5 tabs x 3 days, Print         I have personally and contemperaneously reviewed labs and imaging and used in my decision making as above.   A medical screening exam was performed and I feel the patient has had an appropriate workup for their chief  complaint at this time and likelihood of emergent condition existing is low and thus workup can continue on an outpatient basis.. Their vital signs are stable. They have been counseled on decision, discharge, follow up and which symptoms necessitate immediate return to the emergency department.  They verbally stated understanding and agreement with plan and discharged in stable condition.      Merrily Pew, MD 10/10/15 [place], MD 10/10/15 [phone]

## 2015-10-09 NOTE — ED Notes (Signed)
Patient here to be evaluated for 4th episode of cough symptoms for the past year. Reports that she had hemoptysis this am. No distress, no vomiting or coughing on arrival

## 2016-01-11 ENCOUNTER — Emergency Department (HOSPITAL_COMMUNITY)
Admission: EM | Admit: 2016-01-11 | Discharge: 2016-01-11 | Disposition: A | Discharge status: Home / Self Care | Payer: Medicaid Other | Attending: Emergency Medicine | Admitting: Emergency Medicine

## 2016-01-11 ENCOUNTER — Emergency Department (HOSPITAL_COMMUNITY): Payer: Medicaid Other

## 2016-01-11 ENCOUNTER — Encounter (HOSPITAL_COMMUNITY): Payer: Self-pay | Admitting: Emergency Medicine

## 2016-01-11 DIAGNOSIS — Z79899 Other long term (current) drug therapy: Secondary | ICD-10-CM | POA: Diagnosis not present

## 2016-01-11 DIAGNOSIS — R072 Precordial pain: Secondary | ICD-10-CM | POA: Diagnosis present

## 2016-01-11 DIAGNOSIS — R0789 Other chest pain: Secondary | ICD-10-CM | POA: Diagnosis not present

## 2016-01-11 LAB — BASIC METABOLIC PANEL
Anion gap: 7 (ref 5–15)
BUN: 7 mg/dL (ref 6–20)
CALCIUM: 9.6 mg/dL (ref 8.9–10.3)
CO2: 25 mmol/L (ref 22–32)
CREATININE: 0.81 mg/dL (ref 0.44–1.00)
Chloride: 108 mmol/L (ref 101–111)
GFR calc non Af Amer: >60 mL/min (ref >60)
Glucose, Bld: 150 mg/dL — ABNORMAL HIGH (ref 65–99)
Potassium: 4.3 mmol/L (ref 3.5–5.1)
Sodium: 140 mmol/L (ref 135–145)

## 2016-01-11 LAB — CBC
HCT: 35.6 % — ABNORMAL LOW (ref 36.0–46.0)
Hemoglobin: 11.6 g/dL — ABNORMAL LOW (ref 12.0–15.0)
MCH: 27.6 pg (ref 26.0–34.0)
MCHC: 32.6 g/dL (ref 30.0–36.0)
MCV: 84.8 fL (ref 78.0–100.0)
PLATELETS: 171 10*3/uL (ref 150–400)
RBC: 4.2 MIL/uL (ref 3.87–5.11)
RDW: 14.2 % (ref 11.5–15.5)
WBC: 5.3 10*3/uL (ref 4.0–10.5)

## 2016-01-11 LAB — I-STAT TROPONIN, ED: TROPONIN I, POC: 0 ng/mL (ref 0.00–0.08)

## 2016-01-11 MED ORDER — IBUPROFEN 800 MG PO TABS
800.0000 mg | ORAL_TABLET | Freq: Once | ORAL | Status: AC
  Administered 2016-01-11: 800 mg via ORAL
  Filled 2016-01-11: qty 1

## 2016-01-11 MED ORDER — ACETAMINOPHEN 500 MG PO TABS
1000.0000 mg | ORAL_TABLET | Freq: Once | ORAL | Status: AC
  Administered 2016-01-11: 1000 mg via ORAL
  Filled 2016-01-11: qty 2
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
## 2016-01-11 NOTE — ED Notes (Signed)
IV attempted without success. 

## 2016-01-11 NOTE — Discharge Instructions (Signed)
Take 4 over the counter ibuprofen tablets 3 times a day or 2 over-the-counter naproxen tablets twice a day for pain. Also take tylenol 1000mg(2 extra strength) four times a day.    

## 2016-01-11 NOTE — ED Notes (Signed)
Pt states she has had hodgkins lymphoma in past and state this is similar chest to that pain.

## 2016-01-11 NOTE — ED Triage Notes (Signed)
Pt sts left sided CP intermittent x 2 weeks worse over last two days with pressure and SOB

## 2016-11-20 ENCOUNTER — Ambulatory Visit (HOSPITAL_COMMUNITY)
Admission: EM | Admit: 2016-11-20 | Discharge: 2016-11-20 | Disposition: A | Discharge status: Home / Self Care | Payer: Medicaid Other | Attending: Radiology | Admitting: Radiology

## 2016-11-20 ENCOUNTER — Encounter (HOSPITAL_COMMUNITY): Payer: Self-pay | Admitting: Emergency Medicine

## 2016-11-20 DIAGNOSIS — W57XXXA Bitten or stung by nonvenomous insect and other nonvenomous arthropods, initial encounter: Secondary | ICD-10-CM

## 2016-11-20 DIAGNOSIS — R21 Rash and other nonspecific skin eruption: Secondary | ICD-10-CM | POA: Diagnosis not present

## 2016-11-20 DIAGNOSIS — T07XXXA Unspecified multiple injuries, initial encounter: Secondary | ICD-10-CM | POA: Diagnosis not present

## 2016-11-20 MED ORDER — PERMETHRIN 5 % EX CREA: TOPICAL_CREAM | CUTANEOUS | 0 refills | Status: DC

## 2016-11-20 MED ORDER — PREDNISONE 10 MG (21) PO TBPK: ORAL_TABLET | Freq: Every day | ORAL | 0 refills | Status: DC

## 2016-11-20 MED ORDER — BLOOD GLUCOSE MONITOR KIT: PACK | 0 refills | Status: AC

## 2016-11-20 MED ORDER — DIPHENHYDRAMINE HCL 25 MG PO TABS
25.0000 mg | ORAL_TABLET | Freq: Four times a day (QID) | ORAL | 0 refills | Status: DC
Start: 2016-11-20 — End: 2020-04-15
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
## 2016-11-20 NOTE — ED Triage Notes (Signed)
Noticed insect bites to legs, arms, and torso since Friday.  Patient reports unusual swelling to these bites

## 2017-05-11 ENCOUNTER — Other Ambulatory Visit: Payer: Self-pay

## 2017-05-11 ENCOUNTER — Ambulatory Visit (HOSPITAL_COMMUNITY)
Admission: EM | Admit: 2017-05-11 | Discharge: 2017-05-11 | Disposition: A | Discharge status: Home / Self Care | Payer: Medicaid Other | Attending: Family Medicine | Admitting: Family Medicine

## 2017-05-11 ENCOUNTER — Encounter (HOSPITAL_COMMUNITY): Payer: Self-pay | Admitting: Emergency Medicine

## 2017-05-11 DIAGNOSIS — S39012A Strain of muscle, fascia and tendon of lower back, initial encounter: Secondary | ICD-10-CM | POA: Diagnosis not present

## 2017-05-11 MED ORDER — METHOCARBAMOL 750 MG PO TABS: 750.0000 mg | ORAL_TABLET | Freq: Four times a day (QID) | ORAL | 0 refills | Status: AC
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
## 2017-05-11 NOTE — Discharge Instructions (Signed)
Please take Aleve or Tylenol for pain.  You may supplement this with the muscle relaxer that I have prescribed.  Please also use heating pad and ice to help with pain and swelling.  I expect pain to gradually resolve over the next 2 weeks.  Please return sooner if pain continues to worsen without any improvement.  Please return if you develop any numbness, tingling, weakness.

## 2017-05-17 ENCOUNTER — Ambulatory Visit (HOSPITAL_COMMUNITY)
Admission: EM | Admit: 2017-05-17 | Discharge: 2017-05-17 | Disposition: A | Discharge status: Home / Self Care | Payer: Medicaid Other | Attending: Physician Assistant | Admitting: Physician Assistant

## 2017-05-17 ENCOUNTER — Encounter (HOSPITAL_COMMUNITY): Payer: Self-pay | Admitting: Emergency Medicine

## 2017-05-17 ENCOUNTER — Other Ambulatory Visit: Payer: Self-pay

## 2017-05-17 DIAGNOSIS — M546 Pain in thoracic spine: Secondary | ICD-10-CM

## 2017-05-17 DIAGNOSIS — M542 Cervicalgia: Secondary | ICD-10-CM

## 2017-05-17 MED ORDER — MELOXICAM 7.5 MG PO TABS
7.5000 mg | ORAL_TABLET | Freq: Every day | ORAL | 0 refills | Status: DC
Start: 2017-05-17 — End: 2018-06-30

## 2017-05-17 NOTE — Discharge Instructions (Signed)
No alarming signs on your exam. Start mobic as directed. Continue muscle relaxant as needed. Ice/heat compresses as needed. This can take up to 3-4 weeks to completely resolve, but you should be feeling better each week. Follow up with PCP/orthopedics for further evaluation.   Back  If experience numbness/tingling of the inner thighs, loss of bladder or bowel control, go to the emergency department for evaluation.   Head If experiencing worsening of symptoms, headache/blurry vision, nausea/vomiting, confusion/altered mental status, dizziness, weakness, passing out, imbalance, go to the emergency department for further evaluation.
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
## 2017-06-05 ENCOUNTER — Ambulatory Visit (INDEPENDENT_AMBULATORY_CARE_PROVIDER_SITE_OTHER): Payer: Self-pay | Admitting: Orthopaedic Surgery

## 2017-06-15 ENCOUNTER — Ambulatory Visit (INDEPENDENT_AMBULATORY_CARE_PROVIDER_SITE_OTHER): Payer: Self-pay | Admitting: Orthopaedic Surgery

## 2017-06-27 ENCOUNTER — Encounter (INDEPENDENT_AMBULATORY_CARE_PROVIDER_SITE_OTHER): Payer: Self-pay | Admitting: Orthopedic Surgery

## 2017-06-27 ENCOUNTER — Ambulatory Visit (INDEPENDENT_AMBULATORY_CARE_PROVIDER_SITE_OTHER): Payer: Medicaid Other

## 2017-06-27 ENCOUNTER — Ambulatory Visit (INDEPENDENT_AMBULATORY_CARE_PROVIDER_SITE_OTHER): Payer: Medicaid Other | Admitting: Orthopedic Surgery

## 2017-06-27 DIAGNOSIS — M545 Low back pain: Secondary | ICD-10-CM

## 2017-06-27 DIAGNOSIS — M542 Cervicalgia: Secondary | ICD-10-CM | POA: Diagnosis not present

## 2017-06-28 ENCOUNTER — Encounter (INDEPENDENT_AMBULATORY_CARE_PROVIDER_SITE_OTHER): Payer: Self-pay | Admitting: Orthopedic Surgery
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
## 2017-06-29 NOTE — Addendum Note (Signed)
Addended byLaurann Montana on: 06/29/2017 11:34 AM   Modules accepted: Orders

## 2017-07-05 ENCOUNTER — Telehealth (INDEPENDENT_AMBULATORY_CARE_PROVIDER_SITE_OTHER): Payer: Self-pay | Admitting: Orthopedic Surgery

## 2017-07-05 ENCOUNTER — Inpatient Hospital Stay (HOSPITAL_COMMUNITY)
Admission: AD | Admit: 2017-07-05 | Discharge: 2017-07-06 | Disposition: A | Discharge status: Home / Self Care | Payer: Medicaid Other | Source: Ambulatory Visit | Attending: Obstetrics and Gynecology | Admitting: Obstetrics and Gynecology

## 2017-07-05 ENCOUNTER — Other Ambulatory Visit (INDEPENDENT_AMBULATORY_CARE_PROVIDER_SITE_OTHER): Payer: Self-pay

## 2017-07-05 ENCOUNTER — Encounter (HOSPITAL_COMMUNITY): Payer: Self-pay | Admitting: *Deleted

## 2017-07-05 DIAGNOSIS — M545 Low back pain: Secondary | ICD-10-CM

## 2017-07-05 DIAGNOSIS — N760 Acute vaginitis: Secondary | ICD-10-CM

## 2017-07-05 DIAGNOSIS — N898 Other specified noninflammatory disorders of vagina: Secondary | ICD-10-CM | POA: Diagnosis present

## 2017-07-05 DIAGNOSIS — B9689 Other specified bacterial agents as the cause of diseases classified elsewhere: Secondary | ICD-10-CM | POA: Diagnosis not present

## 2017-07-05 DIAGNOSIS — Z90710 Acquired absence of both cervix and uterus: Secondary | ICD-10-CM | POA: Diagnosis not present

## 2017-07-05 DIAGNOSIS — M546 Pain in thoracic spine: Principal | ICD-10-CM

## 2017-07-05 LAB — WET PREP, GENITAL
Sperm: NONE SEEN
Trich, Wet Prep: NONE SEEN
Yeast Wet Prep HPF POC: NONE SEEN
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
## 2017-07-05 NOTE — Telephone Encounter (Signed)
Patient called advised [place] [facility] (PT) do not except her insurance. Patient said [facility] will except her insurance. Patient said she will need a referral to go to [facility] (PT). The number to contact patient is [phone]

## 2017-07-05 NOTE — MAU Note (Addendum)
NOT IN LOBBY 
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
## 2017-07-05 NOTE — Telephone Encounter (Signed)
IC patient advised order for [facility] outpt PT submitted.

## 2017-07-06 DIAGNOSIS — N760 Acute vaginitis: Secondary | ICD-10-CM | POA: Diagnosis not present

## 2017-07-06 DIAGNOSIS — B9689 Other specified bacterial agents as the cause of diseases classified elsewhere: Secondary | ICD-10-CM

## 2017-07-06 LAB — GC/CHLAMYDIA PROBE AMP (~~LOC~~) NOT AT ARMC
Chlamydia: NEGATIVE
Neisseria Gonorrhea: NEGATIVE

## 2017-07-06 MED ORDER — METRONIDAZOLE 0.75 % VA GEL: 1.0000 | Freq: Every day | VAGINAL | 1 refills | Status: DC

## 2017-07-06 NOTE — Discharge Instructions (Signed)
Bacterial Vaginosis Bacterial vaginosis is a vaginal infection that occurs when the normal balance of bacteria in the vagina is disrupted. It results from an overgrowth of certain bacteria. This is the most common vaginal infection among women ages 15-44. Because bacterial vaginosis increases your risk for STIs (sexually transmitted infections), getting treated can help reduce your risk for chlamydia, gonorrhea, herpes, and HIV (human immunodeficiency virus). Treatment is also important for preventing complications in pregnant women, because this condition can cause an early (premature) delivery. What are the causes? This condition is caused by an increase in harmful bacteria that are normally present in small amounts in the vagina. However, the reason that the condition develops is not fully understood. What increases the risk? The following factors may make you more likely to develop this condition:  Having a new sexual partner or multiple sexual partners.  Having unprotected sex.  Douching.  Having an intrauterine device (IUD).  Smoking.  Drug and alcohol abuse.  Taking certain antibiotic medicines.  Being pregnant.  You cannot get bacterial vaginosis from toilet seats, bedding, swimming pools, or contact with objects around you. What are the signs or symptoms? Symptoms of this condition include:  Grey or white vaginal discharge. The discharge can also be watery or foamy.  A fish-like odor with discharge, especially after sexual intercourse or during menstruation.  Itching in and around the vagina.  Burning or pain with urination.  Some women with bacterial vaginosis have no signs or symptoms. How is this diagnosed? This condition is diagnosed based on:  Your medical history.  A physical exam of the vagina.  Testing a sample of vaginal fluid under a microscope to look for a large amount of bad bacteria or abnormal cells. Your health care provider may use a cotton swab  or a small wooden spatula to collect the sample.  How is this treated? This condition is treated with antibiotics. These may be given as a pill, a vaginal cream, or a medicine that is put into the vagina (suppository). If the condition comes back after treatment, a second round of antibiotics may be needed. Follow these instructions at home: Medicines  Take over-the-counter and prescription medicines only as told by your health care provider.  Take or use your antibiotic as told by your health care provider. Do not stop taking or using the antibiotic even if you start to feel better. General instructions  If you have a female sexual partner, tell her that you have a vaginal infection. She should see her health care provider and be treated if she has symptoms. If you have a female sexual partner, he does not need treatment.  During treatment: ? Avoid sexual activity until you finish treatment. ? Do not douche. ? Avoid alcohol as directed by your health care provider. ? Avoid breastfeeding as directed by your health care provider.  Drink enough water and fluids to keep your urine clear or pale yellow.  Keep the area around your vagina and rectum clean. ? Wash the area daily with warm water. ? Wipe yourself from front to back after using the toilet.  Keep all follow-up visits as told by your health care provider. This is important. How is this prevented?  Do not douche.  Wash the outside of your vagina with warm water only.  Use protection when having sex. This includes latex condoms and dental dams.  Limit how many sexual partners you have. To help prevent bacterial vaginosis, it is best to have sex with just   one partner (monogamous).  Make sure you and your sexual partner are tested for STIs.  Wear cotton or cotton-lined underwear.  Avoid wearing tight pants and pantyhose, especially during summer.  Limit the amount of alcohol that you drink.  Do not use any products that  contain nicotine or tobacco, such as cigarettes and e-cigarettes. If you need help quitting, ask your health care provider.  Do not use illegal drugs. Where to find more information:  Centers for Disease Control and Prevention: www.cdc.gov/std  American Sexual Health Association (ASHA): www.ashastd.org  U.S. Department of Health and Human Services, Office on Women's Health: www.womenshealth.gov/ or https://www.womenshealth.gov/a-z-topics/bacterial-vaginosis Contact a health care provider if:  Your symptoms do not improve, even after treatment.  You have more discharge or pain when urinating.  You have a fever.  You have pain in your abdomen.  You have pain during sex.  You have vaginal bleeding between periods. Summary  Bacterial vaginosis is a vaginal infection that occurs when the normal balance of bacteria in the vagina is disrupted.  Because bacterial vaginosis increases your risk for STIs (sexually transmitted infections), getting treated can help reduce your risk for chlamydia, gonorrhea, herpes, and HIV (human immunodeficiency virus). Treatment is also important for preventing complications in pregnant women, because the condition can cause an early (premature) delivery.  This condition is treated with antibiotic medicines. These may be given as a pill, a vaginal cream, or a medicine that is put into the vagina (suppository). This information is not intended to replace advice given to you by your health care provider. Make sure you discuss any questions you have with your health care provider. Document Released: 03/06/2005 Document Revised: 07/10/2016 Document Reviewed: 11/20/2015 Elsevier Interactive Patient Education  2018 [employer]vier Inc[employer]  

## 2017-07-07 LAB — HIV ANTIBODY (ROUTINE TESTING W REFLEX): HIV SCREEN 4TH GENERATION: NONREACTIVE

## 2017-07-07 LAB — RPR: RPR Ser Ql: NONREACTIVE

## 2017-07-23 ENCOUNTER — Ambulatory Visit: Payer: Medicaid Other | Attending: Orthopedic Surgery | Admitting: Physical Therapy

## 2017-07-23 DIAGNOSIS — M542 Cervicalgia: Secondary | ICD-10-CM | POA: Insufficient documentation

## 2017-07-23 DIAGNOSIS — M545 Low back pain: Secondary | ICD-10-CM | POA: Insufficient documentation

## 2017-07-23 DIAGNOSIS — R29898 Other symptoms and signs involving the musculoskeletal system: Secondary | ICD-10-CM | POA: Insufficient documentation

## 2017-07-23 DIAGNOSIS — M6281 Muscle weakness (generalized): Secondary | ICD-10-CM | POA: Insufficient documentation

## 2017-07-23 DIAGNOSIS — G8929 Other chronic pain: Secondary | ICD-10-CM | POA: Insufficient documentation

## 2017-07-25 ENCOUNTER — Ambulatory Visit: Payer: Medicaid Other | Admitting: Physical Therapy

## 2017-08-01 ENCOUNTER — Ambulatory Visit: Payer: Medicaid Other | Admitting: Physical Therapy

## 2017-08-01 ENCOUNTER — Encounter: Payer: Self-pay | Admitting: Physical Therapy

## 2017-08-01 ENCOUNTER — Other Ambulatory Visit: Payer: Self-pay

## 2017-08-01 DIAGNOSIS — M542 Cervicalgia: Secondary | ICD-10-CM | POA: Diagnosis present

## 2017-08-01 DIAGNOSIS — R29898 Other symptoms and signs involving the musculoskeletal system: Secondary | ICD-10-CM

## 2017-08-01 DIAGNOSIS — M545 Low back pain: Principal | ICD-10-CM

## 2017-08-01 DIAGNOSIS — M6281 Muscle weakness (generalized): Secondary | ICD-10-CM | POA: Diagnosis present

## 2017-08-01 DIAGNOSIS — G8929 Other chronic pain: Secondary | ICD-10-CM | POA: Diagnosis present
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
## 2017-08-08 ENCOUNTER — Ambulatory Visit: Payer: Medicaid Other | Admitting: Physical Therapy

## 2017-08-15 ENCOUNTER — Ambulatory Visit: Payer: Medicaid Other

## 2017-08-22 ENCOUNTER — Ambulatory Visit: Payer: Medicaid Other | Attending: Orthopedic Surgery | Admitting: Physical Therapy

## 2017-08-22 ENCOUNTER — Encounter: Payer: Self-pay | Admitting: Physical Therapy

## 2017-08-22 DIAGNOSIS — R29898 Other symptoms and signs involving the musculoskeletal system: Secondary | ICD-10-CM

## 2017-08-22 DIAGNOSIS — G8929 Other chronic pain: Secondary | ICD-10-CM | POA: Diagnosis present

## 2017-08-22 DIAGNOSIS — M545 Low back pain: Secondary | ICD-10-CM | POA: Diagnosis not present

## 2017-08-22 DIAGNOSIS — M6281 Muscle weakness (generalized): Secondary | ICD-10-CM

## 2017-08-22 DIAGNOSIS — M542 Cervicalgia: Secondary | ICD-10-CM | POA: Diagnosis present
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
## 2017-08-28 ENCOUNTER — Ambulatory Visit: Payer: Medicaid Other | Admitting: Physical Therapy

## 2018-01-24 ENCOUNTER — Ambulatory Visit: Payer: Medicaid Other | Admitting: Internal Medicine

## 2018-04-03 ENCOUNTER — Ambulatory Visit (INDEPENDENT_AMBULATORY_CARE_PROVIDER_SITE_OTHER): Payer: Medicaid Other | Admitting: Orthopedic Surgery

## 2018-05-23 ENCOUNTER — Emergency Department (HOSPITAL_COMMUNITY)
Admission: EM | Admit: 2018-05-23 | Discharge: 2018-05-23 | Disposition: A | Discharge status: Home / Self Care | Payer: Medicaid Other | Attending: Emergency Medicine | Admitting: Emergency Medicine

## 2018-05-23 ENCOUNTER — Emergency Department (HOSPITAL_COMMUNITY): Payer: Medicaid Other

## 2018-05-23 ENCOUNTER — Other Ambulatory Visit: Payer: Self-pay

## 2018-05-23 ENCOUNTER — Encounter (HOSPITAL_COMMUNITY): Payer: Self-pay | Admitting: *Deleted

## 2018-05-23 DIAGNOSIS — F329 Major depressive disorder, single episode, unspecified: Secondary | ICD-10-CM | POA: Insufficient documentation

## 2018-05-23 DIAGNOSIS — R1084 Generalized abdominal pain: Secondary | ICD-10-CM | POA: Insufficient documentation

## 2018-05-23 DIAGNOSIS — Z8572 Personal history of non-Hodgkin lymphomas: Secondary | ICD-10-CM | POA: Insufficient documentation

## 2018-05-23 DIAGNOSIS — Z79899 Other long term (current) drug therapy: Secondary | ICD-10-CM | POA: Insufficient documentation

## 2018-05-23 DIAGNOSIS — R112 Nausea with vomiting, unspecified: Secondary | ICD-10-CM | POA: Diagnosis present

## 2018-05-23 LAB — COMPREHENSIVE METABOLIC PANEL
ALK PHOS: 42 U/L (ref 38–126)
ALT: 37 U/L (ref 0–44)
ANION GAP: 8 (ref 5–15)
AST: 35 U/L (ref 15–41)
Albumin: 4.3 g/dL (ref 3.5–5.0)
BILIRUBIN TOTAL: 0.5 mg/dL (ref 0.3–1.2)
BUN: 10 mg/dL (ref 6–20)
CALCIUM: 8.7 mg/dL — AB (ref 8.9–10.3)
CO2: 24 mmol/L (ref 22–32)
Chloride: 105 mmol/L (ref 98–111)
Creatinine, Ser: 0.86 mg/dL (ref 0.44–1.00)
GFR calc non Af Amer: >60 mL/min (ref >60)
Glucose, Bld: 99 mg/dL (ref 70–99)
Potassium: 3.6 mmol/L (ref 3.5–5.1)
Sodium: 137 mmol/L (ref 135–145)
TOTAL PROTEIN: 7.2 g/dL (ref 6.5–8.1)

## 2018-05-23 LAB — CBC
HCT: 40.8 % (ref 36.0–46.0)
Hemoglobin: 12.3 g/dL (ref 12.0–15.0)
MCH: 26.9 pg (ref 26.0–34.0)
MCHC: 30.1 g/dL (ref 30.0–36.0)
MCV: 89.1 fL (ref 80.0–100.0)
NRBC: 0 % (ref 0.0–0.2)
PLATELETS: 167 10*3/uL (ref 150–400)
RBC: 4.58 MIL/uL (ref 3.87–5.11)
RDW: 13.1 % (ref 11.5–15.5)
WBC: 5.2 10*3/uL (ref 4.0–10.5)

## 2018-05-23 LAB — LIPASE, BLOOD: Lipase: 37 U/L (ref 11–51)

## 2018-05-23 LAB — URINALYSIS, ROUTINE W REFLEX MICROSCOPIC
Bilirubin Urine: NEGATIVE
Glucose, UA: NEGATIVE mg/dL
HGB URINE DIPSTICK: NEGATIVE
Ketones, ur: NEGATIVE mg/dL
LEUKOCYTE UA: NEGATIVE
Nitrite: NEGATIVE
PROTEIN: NEGATIVE mg/dL
Specific Gravity, Urine: 1.02 (ref 1.005–1.030)
pH: 5 (ref 5.0–8.0)

## 2018-05-23 LAB — I-STAT BETA HCG BLOOD, ED (MC, WL, AP ONLY): I-stat hCG, quantitative: <5 m[IU]/mL (ref <5)

## 2018-05-23 MED ORDER — IOPAMIDOL (ISOVUE-300) INJECTION 61%
100.0000 mL | Freq: Once | INTRAVENOUS | Status: AC | PRN
  Administered 2018-05-23: 100 mL via INTRAVENOUS

## 2018-05-23 MED ORDER — ONDANSETRON HCL 4 MG/2ML IJ SOLN
4.0000 mg | Freq: Once | INTRAMUSCULAR | Status: AC
  Administered 2018-05-23: 4 mg via INTRAVENOUS
  Filled 2018-05-23: qty 2

## 2018-05-23 MED ORDER — SODIUM CHLORIDE 0.9 % IV BOLUS
1000.0000 mL | Freq: Once | INTRAVENOUS | Status: AC
  Administered 2018-05-23: 1000 mL via INTRAVENOUS

## 2018-05-23 MED ORDER — KETOROLAC TROMETHAMINE 15 MG/ML IJ SOLN
15.0000 mg | Freq: Once | INTRAMUSCULAR | Status: AC
  Administered 2018-05-23: 15 mg via INTRAVENOUS
  Filled 2018-05-23: qty 1

## 2018-05-23 MED ORDER — IOPAMIDOL (ISOVUE-300) INJECTION 61%
INTRAVENOUS | Status: AC
  Filled 2018-05-23: qty 100

## 2018-05-23 MED ORDER — SODIUM CHLORIDE (PF) 0.9 % IJ SOLN
INTRAMUSCULAR | Status: AC
  Filled 2018-05-23: qty 50

## 2018-05-23 MED ORDER — ONDANSETRON HCL 4 MG/2ML IJ SOLN
4.00 | INTRAMUSCULAR | Status: DC
Start: ? — End: 2018-05-23

## 2018-05-23 MED ORDER — SODIUM CHLORIDE 0.9% FLUSH
3.0000 mL | Freq: Once | INTRAVENOUS | Status: AC
  Administered 2018-05-23: 3 mL via INTRAVENOUS

## 2018-05-23 NOTE — ED Triage Notes (Signed)
Pt complains of lower abdominal pain, nausea, and flank pain x 3 days. Pt went to PCP and seen last night at [facility] but states she still does not feel better.
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
## 2018-05-23 NOTE — ED Notes (Signed)
Pt resting when I went into room. VSS. Pain 9/10.  Pt is transported to CT at this time

## 2018-05-23 NOTE — Discharge Instructions (Addendum)
Please return for any problem.  Follow-up with your regular care provider as instructed. °

## 2018-05-27 ENCOUNTER — Encounter (HOSPITAL_COMMUNITY): Payer: Self-pay

## 2018-05-27 ENCOUNTER — Other Ambulatory Visit: Payer: Self-pay

## 2018-05-27 ENCOUNTER — Emergency Department (HOSPITAL_COMMUNITY)
Admission: EM | Admit: 2018-05-27 | Discharge: 2018-05-27 | Disposition: A | Discharge status: Home / Self Care | Payer: Medicaid Other | Attending: Emergency Medicine | Admitting: Emergency Medicine

## 2018-05-27 DIAGNOSIS — Z5321 Procedure and treatment not carried out due to patient leaving prior to being seen by health care provider: Secondary | ICD-10-CM | POA: Diagnosis not present

## 2018-05-27 DIAGNOSIS — R1110 Vomiting, unspecified: Secondary | ICD-10-CM | POA: Insufficient documentation

## 2018-05-27 LAB — COMPREHENSIVE METABOLIC PANEL
ALBUMIN: 4.6 g/dL (ref 3.5–5.0)
ALT: 34 U/L (ref 0–44)
ANION GAP: 7 (ref 5–15)
AST: 33 U/L (ref 15–41)
Alkaline Phosphatase: 42 U/L (ref 38–126)
BUN: 13 mg/dL (ref 6–20)
CHLORIDE: 106 mmol/L (ref 98–111)
CO2: 27 mmol/L (ref 22–32)
Calcium: 9.3 mg/dL (ref 8.9–10.3)
Creatinine, Ser: 0.98 mg/dL (ref 0.44–1.00)
GFR calc non Af Amer: >60 mL/min (ref >60)
GLUCOSE: 118 mg/dL — AB (ref 70–99)
POTASSIUM: 3.8 mmol/L (ref 3.5–5.1)
SODIUM: 140 mmol/L (ref 135–145)
Total Bilirubin: 0.5 mg/dL (ref 0.3–1.2)
Total Protein: 7.8 g/dL (ref 6.5–8.1)

## 2018-05-27 LAB — CBC
HCT: 39.8 % (ref 36.0–46.0)
Hemoglobin: 12.3 g/dL (ref 12.0–15.0)
MCH: 27.4 pg (ref 26.0–34.0)
MCHC: 30.9 g/dL (ref 30.0–36.0)
MCV: 88.6 fL (ref 80.0–100.0)
NRBC: 0 % (ref 0.0–0.2)
Platelets: 174 10*3/uL (ref 150–400)
RBC: 4.49 MIL/uL (ref 3.87–5.11)
RDW: 13.2 % (ref 11.5–15.5)
WBC: 10.4 10*3/uL (ref 4.0–10.5)

## 2018-05-27 LAB — I-STAT BETA HCG BLOOD, ED (MC, WL, AP ONLY): I-stat hCG, quantitative: <5 m[IU]/mL (ref <5)

## 2018-05-27 LAB — LIPASE, BLOOD: LIPASE: 35 U/L (ref 11–51)

## 2018-05-27 NOTE — ED Triage Notes (Signed)
Pt coming from home c/o emesis and right flank pain that started at 6pm today with 7 episodes. No relief from Zofran. Seen on Saturday for stomach bug.

## 2018-06-30 ENCOUNTER — Emergency Department (HOSPITAL_COMMUNITY)
Admission: EM | Admit: 2018-06-30 | Discharge: 2018-06-30 | Disposition: A | Discharge status: Home / Self Care | Payer: Medicaid Other | Attending: Emergency Medicine | Admitting: Emergency Medicine

## 2018-06-30 ENCOUNTER — Emergency Department (HOSPITAL_COMMUNITY): Payer: Medicaid Other

## 2018-06-30 ENCOUNTER — Other Ambulatory Visit: Payer: Self-pay

## 2018-06-30 ENCOUNTER — Encounter (HOSPITAL_COMMUNITY): Payer: Self-pay

## 2018-06-30 DIAGNOSIS — Z8572 Personal history of non-Hodgkin lymphomas: Secondary | ICD-10-CM | POA: Diagnosis not present

## 2018-06-30 DIAGNOSIS — S39012A Strain of muscle, fascia and tendon of lower back, initial encounter: Secondary | ICD-10-CM | POA: Diagnosis not present

## 2018-06-30 DIAGNOSIS — R109 Unspecified abdominal pain: Secondary | ICD-10-CM | POA: Diagnosis not present

## 2018-06-30 DIAGNOSIS — E119 Type 2 diabetes mellitus without complications: Secondary | ICD-10-CM | POA: Diagnosis not present

## 2018-06-30 DIAGNOSIS — R110 Nausea: Secondary | ICD-10-CM | POA: Insufficient documentation

## 2018-06-30 DIAGNOSIS — S3992XA Unspecified injury of lower back, initial encounter: Secondary | ICD-10-CM | POA: Diagnosis present

## 2018-06-30 DIAGNOSIS — X500XXA Overexertion from strenuous movement or load, initial encounter: Secondary | ICD-10-CM | POA: Diagnosis not present

## 2018-06-30 DIAGNOSIS — Y999 Unspecified external cause status: Secondary | ICD-10-CM | POA: Insufficient documentation

## 2018-06-30 DIAGNOSIS — Y929 Unspecified place or not applicable: Secondary | ICD-10-CM | POA: Insufficient documentation

## 2018-06-30 DIAGNOSIS — Y9389 Activity, other specified: Secondary | ICD-10-CM | POA: Diagnosis not present

## 2018-06-30 DIAGNOSIS — R202 Paresthesia of skin: Secondary | ICD-10-CM | POA: Insufficient documentation

## 2018-06-30 DIAGNOSIS — Z79899 Other long term (current) drug therapy: Secondary | ICD-10-CM | POA: Diagnosis not present

## 2018-06-30 HISTORY — DX: Type 2 diabetes mellitus without complications: E11.9

## 2018-06-30 LAB — COMPREHENSIVE METABOLIC PANEL
ALT: 40 U/L (ref 0–44)
AST: 42 U/L — ABNORMAL HIGH (ref 15–41)
Albumin: 4.5 g/dL (ref 3.5–5.0)
Alkaline Phosphatase: 42 U/L (ref 38–126)
Anion gap: 8 (ref 5–15)
BUN: 11 mg/dL (ref 6–20)
CO2: 26 mmol/L (ref 22–32)
Calcium: 9.1 mg/dL (ref 8.9–10.3)
Chloride: 103 mmol/L (ref 98–111)
Creatinine, Ser: 0.93 mg/dL (ref 0.44–1.00)
GFR calc Af Amer: >60 mL/min (ref >60)
GFR calc non Af Amer: >60 mL/min (ref >60)
Glucose, Bld: 153 mg/dL — ABNORMAL HIGH (ref 70–99)
Potassium: 3.6 mmol/L (ref 3.5–5.1)
Sodium: 137 mmol/L (ref 135–145)
Total Bilirubin: 0.5 mg/dL (ref 0.3–1.2)
Total Protein: 7.7 g/dL (ref 6.5–8.1)

## 2018-06-30 LAB — CBC WITH DIFFERENTIAL/PLATELET
Abs Immature Granulocytes: 0.01 10*3/uL (ref 0.00–0.07)
Basophils Absolute: 0 10*3/uL (ref 0.0–0.1)
Basophils Relative: 0 %
Eosinophils Absolute: 0.2 10*3/uL (ref 0.0–0.5)
Eosinophils Relative: 3 %
HCT: 39.9 % (ref 36.0–46.0)
Hemoglobin: 12.6 g/dL (ref 12.0–15.0)
Immature Granulocytes: 0 %
Lymphocytes Relative: 36 %
Lymphs Abs: 2.1 10*3/uL (ref 0.7–4.0)
MCH: 27.9 pg (ref 26.0–34.0)
MCHC: 31.6 g/dL (ref 30.0–36.0)
MCV: 88.3 fL (ref 80.0–100.0)
Monocytes Absolute: 0.3 10*3/uL (ref 0.1–1.0)
Monocytes Relative: 6 %
Neutro Abs: 3.2 10*3/uL (ref 1.7–7.7)
Neutrophils Relative %: 55 %
Platelets: 181 10*3/uL (ref 150–400)
RBC: 4.52 MIL/uL (ref 3.87–5.11)
RDW: 13.9 % (ref 11.5–15.5)
WBC: 5.8 10*3/uL (ref 4.0–10.5)
nRBC: 0 % (ref 0.0–0.2)

## 2018-06-30 LAB — URINALYSIS, ROUTINE W REFLEX MICROSCOPIC
Bilirubin Urine: NEGATIVE
Glucose, UA: NEGATIVE mg/dL
Hgb urine dipstick: NEGATIVE
Ketones, ur: NEGATIVE mg/dL
Leukocytes,Ua: NEGATIVE
Nitrite: NEGATIVE
Protein, ur: NEGATIVE mg/dL
Specific Gravity, Urine: 1.01 (ref 1.005–1.030)
pH: 5 (ref 5.0–8.0)

## 2018-06-30 LAB — POC URINE PREG, ED: Preg Test, Ur: NEGATIVE

## 2018-06-30 MED ORDER — LIDOCAINE 5 % EX PTCH
1.0000 | MEDICATED_PATCH | CUTANEOUS | Status: DC
  Administered 2018-06-30: 1 via TRANSDERMAL
  Filled 2018-06-30: qty 1

## 2018-06-30 MED ORDER — IBUPROFEN 800 MG PO TABS: 800.0000 mg | ORAL_TABLET | Freq: Three times a day (TID) | ORAL | 0 refills | Status: AC

## 2018-06-30 MED ORDER — CYCLOBENZAPRINE HCL 10 MG PO TABS: 10.0000 mg | ORAL_TABLET | Freq: Three times a day (TID) | ORAL | 0 refills | Status: DC | PRN

## 2018-06-30 MED ORDER — KETOROLAC TROMETHAMINE 30 MG/ML IJ SOLN
30.0000 mg | Freq: Once | INTRAMUSCULAR | Status: AC
  Administered 2018-06-30: 30 mg via INTRAVENOUS
  Filled 2018-06-30: qty 1
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
## 2018-06-30 NOTE — ED Notes (Signed)
Pt reports falling today while walking to her refrigerator. Pt is unsure why or what made her fall. Denies tripping on anything. States she fell on her left side, denies hitting her head or loc.

## 2018-06-30 NOTE — ED Triage Notes (Signed)
Patient c/o left flank pain x 6 days. Patient reports a history of kidney stones.

## 2019-03-24 ENCOUNTER — Ambulatory Visit: Payer: Medicaid Other | Attending: Internal Medicine

## 2019-03-24 DIAGNOSIS — U071 COVID-19: Secondary | ICD-10-CM

## 2019-03-25 LAB — NOVEL CORONAVIRUS, NAA: SARS-CoV-2, NAA: NOT DETECTED

## 2019-09-30 ENCOUNTER — Encounter (HOSPITAL_COMMUNITY): Payer: Self-pay

## 2019-09-30 ENCOUNTER — Emergency Department (HOSPITAL_COMMUNITY)
Admission: EM | Admit: 2019-09-30 | Discharge: 2019-09-30 | Disposition: A | Discharge status: Home / Self Care | Payer: Medicaid Other | Attending: Emergency Medicine | Admitting: Emergency Medicine

## 2019-09-30 DIAGNOSIS — Z5321 Procedure and treatment not carried out due to patient leaving prior to being seen by health care provider: Secondary | ICD-10-CM | POA: Diagnosis not present

## 2019-09-30 DIAGNOSIS — J029 Acute pharyngitis, unspecified: Secondary | ICD-10-CM | POA: Diagnosis not present

## 2019-09-30 DIAGNOSIS — R42 Dizziness and giddiness: Secondary | ICD-10-CM | POA: Diagnosis not present

## 2019-09-30 LAB — GROUP A STREP BY PCR: Group A Strep by PCR: NOT DETECTED

## 2019-09-30 NOTE — ED Notes (Signed)
I called patient name in the lobby and outside to recheck vitals and no one responded

## 2019-09-30 NOTE — ED Triage Notes (Signed)
Pt complains of a sore throat since yesterday, denies andy fevers or cough, she states she has been dizzy

## 2019-09-30 NOTE — ED Notes (Signed)
Called for VS check with no answer, followed by 2 more times.

## 2019-10-19 ENCOUNTER — Emergency Department (HOSPITAL_COMMUNITY): Payer: Medicaid Other

## 2019-10-19 ENCOUNTER — Other Ambulatory Visit: Payer: Self-pay

## 2019-10-19 ENCOUNTER — Emergency Department (HOSPITAL_COMMUNITY)
Admission: EM | Admit: 2019-10-19 | Discharge: 2019-10-19 | Disposition: A | Discharge status: Home / Self Care | Payer: Medicaid Other | Attending: Emergency Medicine | Admitting: Emergency Medicine

## 2019-10-19 ENCOUNTER — Encounter (HOSPITAL_COMMUNITY): Payer: Self-pay | Admitting: Emergency Medicine

## 2019-10-19 DIAGNOSIS — R0789 Other chest pain: Secondary | ICD-10-CM | POA: Insufficient documentation

## 2019-10-19 DIAGNOSIS — Z5321 Procedure and treatment not carried out due to patient leaving prior to being seen by health care provider: Secondary | ICD-10-CM | POA: Insufficient documentation

## 2019-10-19 LAB — CBC
HCT: 38.8 % (ref 36.0–46.0)
Hemoglobin: 12.1 g/dL (ref 12.0–15.0)
MCH: 27.1 pg (ref 26.0–34.0)
MCHC: 31.2 g/dL (ref 30.0–36.0)
MCV: 86.8 fL (ref 80.0–100.0)
Platelets: 179 10*3/uL (ref 150–400)
RBC: 4.47 MIL/uL (ref 3.87–5.11)
RDW: 13.6 % (ref 11.5–15.5)
WBC: 7 10*3/uL (ref 4.0–10.5)
nRBC: 0 % (ref 0.0–0.2)

## 2019-10-19 LAB — BASIC METABOLIC PANEL
Anion gap: 13 (ref 5–15)
BUN: 9 mg/dL (ref 6–20)
CO2: 22 mmol/L (ref 22–32)
Calcium: 8.9 mg/dL (ref 8.9–10.3)
Chloride: 102 mmol/L (ref 98–111)
Creatinine, Ser: 0.92 mg/dL (ref 0.44–1.00)
GFR calc Af Amer: >60 mL/min (ref >60)
GFR calc non Af Amer: >60 mL/min (ref >60)
Glucose, Bld: 202 mg/dL — ABNORMAL HIGH (ref 70–99)
Potassium: 3.5 mmol/L (ref 3.5–5.1)
Sodium: 137 mmol/L (ref 135–145)

## 2019-10-19 LAB — TROPONIN I (HIGH SENSITIVITY): Troponin I (High Sensitivity): <2 ng/L (ref <18)

## 2019-10-19 LAB — I-STAT BETA HCG BLOOD, ED (MC, WL, AP ONLY): I-stat hCG, quantitative: <5 m[IU]/mL (ref <5)

## 2019-10-19 LAB — PROTIME-INR
INR: 0.9 (ref 0.8–1.2)
Prothrombin Time: 11.7 seconds (ref 11.4–15.2)

## 2019-10-19 MED ORDER — SODIUM CHLORIDE 0.9% FLUSH: 3.0000 mL | Freq: Once | INTRAVENOUS | Status: DC

## 2019-10-19 NOTE — ED Triage Notes (Signed)
Patient reports right chest pain this morning , denies SOB , no emesis or diaphoresis , no cough or fever , pain increases when lying .

## 2019-10-30 ENCOUNTER — Other Ambulatory Visit: Payer: Self-pay

## 2019-10-30 ENCOUNTER — Emergency Department (HOSPITAL_COMMUNITY): Admission: EM | Admit: 2019-10-30 | Discharge: 2019-10-31 | Discharge status: Against Medical Advice | Payer: Self-pay

## 2020-01-01 ENCOUNTER — Ambulatory Visit (HOSPITAL_COMMUNITY)
Admission: EM | Admit: 2020-01-01 | Discharge: 2020-01-01 | Discharge status: Against Medical Advice | Payer: Medicaid Other

## 2020-01-01 ENCOUNTER — Ambulatory Visit
Admission: EM | Admit: 2020-01-01 | Discharge: 2020-01-01 | Disposition: A | Discharge status: Home / Self Care | Payer: Medicaid Other

## 2020-01-01 ENCOUNTER — Encounter: Payer: Self-pay | Admitting: Emergency Medicine

## 2020-01-01 ENCOUNTER — Other Ambulatory Visit: Payer: Self-pay

## 2020-01-01 DIAGNOSIS — R739 Hyperglycemia, unspecified: Secondary | ICD-10-CM

## 2020-01-01 DIAGNOSIS — E119 Type 2 diabetes mellitus without complications: Secondary | ICD-10-CM | POA: Diagnosis not present

## 2020-01-01 DIAGNOSIS — E1165 Type 2 diabetes mellitus with hyperglycemia: Secondary | ICD-10-CM | POA: Diagnosis not present

## 2020-01-01 DIAGNOSIS — N951 Menopausal and female climacteric states: Secondary | ICD-10-CM

## 2020-01-01 DIAGNOSIS — Z760 Encounter for issue of repeat prescription: Secondary | ICD-10-CM

## 2020-01-01 LAB — POCT FASTING CBG KUC MANUAL ENTRY: POCT Glucose (KUC): 309 mg/dL — AB (ref 70–99)

## 2020-01-01 LAB — POCT URINALYSIS DIP (MANUAL ENTRY)
Bilirubin, UA: NEGATIVE
Glucose, UA: 500 mg/dL — AB
Leukocytes, UA: NEGATIVE
Nitrite, UA: NEGATIVE
Protein Ur, POC: NEGATIVE mg/dL
Spec Grav, UA: >=1.03 — AB (ref 1.010–1.025)
Urobilinogen, UA: 0.2 E.U./dL
pH, UA: 6.5 (ref 5.0–8.0)

## 2020-01-01 MED ORDER — CITALOPRAM HYDROBROMIDE 40 MG PO TABS: 40.0000 mg | ORAL_TABLET | Freq: Every day | ORAL | 3 refills | Status: AC

## 2020-01-01 MED ORDER — METFORMIN HCL 500 MG PO TABS: 500.0000 mg | ORAL_TABLET | Freq: Two times a day (BID) | ORAL | 3 refills | Status: AC

## 2020-01-01 MED ORDER — ESTROGENS CONJUGATED 0.625 MG PO TABS: ORAL_TABLET | ORAL | 3 refills | Status: AC
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
## 2020-01-01 NOTE — ED Triage Notes (Signed)
Pt here for refills on metformin and meds for "hot flashes"

## 2020-01-01 NOTE — Discharge Instructions (Signed)
I have refilled your medications today  Your blood sugar is elevated today, but your urine looks good  Keep a close eye on your diet and blood sugar to help keep it controlled  I have attached information about ketoacidosis so you can be aware of what  to look fore  Get established with primary care as soon as possible

## 2020-02-04 ENCOUNTER — Ambulatory Visit: Payer: Medicaid Other | Admitting: Orthopedic Surgery

## 2020-04-15 ENCOUNTER — Ambulatory Visit (INDEPENDENT_AMBULATORY_CARE_PROVIDER_SITE_OTHER): Payer: Medicaid Other | Admitting: Obstetrics and Gynecology

## 2020-04-15 ENCOUNTER — Other Ambulatory Visit: Payer: Self-pay

## 2020-04-15 ENCOUNTER — Encounter: Payer: Self-pay | Admitting: Obstetrics and Gynecology

## 2020-04-15 VITALS — BP 105/73 | HR 94 | Ht 66.5 in | Wt 174.0 lb

## 2020-04-15 DIAGNOSIS — E8941 Symptomatic postprocedural ovarian failure: Secondary | ICD-10-CM

## 2020-04-15 DIAGNOSIS — Z01419 Encounter for gynecological examination (general) (routine) without abnormal findings: Secondary | ICD-10-CM

## 2020-04-15 DIAGNOSIS — Z79890 Hormone replacement therapy (postmenopausal): Secondary | ICD-10-CM

## 2020-04-15 DIAGNOSIS — L680 Hirsutism: Secondary | ICD-10-CM

## 2020-04-15 NOTE — Progress Notes (Signed)
Dr Kalman Shan did her hysterectomy in 2007  Talk about going back on hormones Last mammo in 2021-WNL  Talk about hair texture since getting chemo

## 2020-04-15 NOTE — Progress Notes (Signed)
Obstetrics and Gynecology Annual Patient Evaluation  Appointment Date: 04/15/2020  OBGYN Clinic: [facility]  Primary Care Provider: Patient, No Pcp Per   Chief Complaint:  Chief Complaint  Patient presents with  . Gynecologic Exam  Hot flashes, hirsutism   History of Present Illness: Victoria Ali is a 50 y.o. African-American G4P2002 (No LMP recorded. Patient has had a hysterectomy.), seen for the above chief complaint. Her past medical history is significant for diabetes, h/o hodgkin's lymphoma (remissions since 2011).  Hot flashes: every since her 2007 TVH/BSO (for pelvic pain). She states she's been on po HRT since then  Hirsutism: hair on chest, face.    Review of Systems: Pertinent items noted in HPI and remainder of comprehensive ROS otherwise negative.     Past Medical History:  Past Medical History:  Diagnosis Date  . Depression   . Diabetes mellitus without complication ([facility])   . IBS (irritable bowel syndrome)   . Lymphoma ([facility])   . Renal stones     Past Surgical History:  Past Surgical History:  Procedure Laterality Date  . LAPAROSCOPY    . LITHOTRIPSY    . LITHOTRIPSY    . TOTAL VAGINAL HYSTERECTOMY  2007   with BSO. for pelvic pain    Past Obstetrical History:  OB History  Gravida Para Term Preterm AB Living  4 2 2     2  SAB IAB Ectopic Multiple Live Births          2    # Outcome Date GA Lbr Len/2nd Weight Sex Delivery Anes PTL Lv  4 Term           3 Term           2 Gravida      Vag-Spont     1 Gravida      Vag-Spont       Past Gynecological History: As per HPI.  Social History:  Social History   Socioeconomic History  . Marital status: Single    Spouse name: Not on file  . Number of children: Not on file  . Years of education: Not on file  . Highest education level: Not on file  Occupational History  . Not on file  Tobacco Use  . Smoking status: Never Smoker  . Smokeless tobacco: Never Used   Vaping Use  . Vaping Use: Never used  Substance and Sexual Activity  . Alcohol use: No  . Drug use: No  . Sexual activity: Yes    Birth control/protection: Surgical  Other Topics Concern  . Not on file  Social History Narrative  . Not on file   Social Determinants of Health   Financial Resource Strain: Not on file  Food Insecurity: Not on file  Transportation Needs: Not on file  Physical Activity: Not on file  Stress: Not on file  Social Connections: Not on file  Intimate Partner Violence: Not on file    Family History:  Family History  Problem Relation Age of Onset  . Diabetes Mother   . Breast cancer Maternal Grandmother      Medications Victoria Ali had no medications administered during this visit. Current Outpatient Medications  Medication Sig Dispense Refill  . citalopram (CELEXA) 40 MG tablet Take 1 tablet (40 mg total) by mouth daily. 30 tablet 3  . estrogens, conjugated, (PREMARIN) 0.625 MG tablet Take daily for 21 days then do not take for 7 days. 30 tablet 3  . loratadine (CLARITIN) 10   MG tablet Take 1 tablet (10 mg total) by mouth daily. One po daily x 5 days 5 tablet 0  . metFORMIN (GLUCOPHAGE) 500 MG tablet Take 1 tablet (500 mg total) by mouth 2 (two) times daily with a meal. 60 tablet 3  . blood glucose meter kit and supplies KIT Dispense based on patient and insurance preference. Use up to four times daily as directed. (FOR ICD-9 250.00, 250.01). 1 each 0  . Cyanocobalamin (VITAMIN B-12) 2500 MCG SUBL Place 1 tablet under the tongue daily. (Patient not taking: Reported on 04/15/2020)    . ergocalciferol (VITAMIN D2) 50000 UNITS capsule Take 50,000 Units by mouth once a week. Mondays (Patient not taking: Reported on 04/15/2020)    . ferrous sulfate 325 (65 FE) MG tablet Take 325 mg by mouth daily.   (Patient not taking: Reported on 04/15/2020)    . Lactase (LACTAID PO) Take 1 tablet by mouth daily as needed (nausea and lactose intolerance).    . Multiple  Vitamins-Minerals (MULTIVITAMIN & MINERAL PO) Take 1 tablet by mouth daily. (Patient not taking: Reported on 04/15/2020)     No current facility-administered medications for this visit.    Allergies Spironolactone, Keflex [cephalexin], and Shellfish allergy   Physical Exam:  BP 105/73   Pulse 94   Ht 5' 6.5" (1.689 m)   Wt 174 lb (78.9 kg)   BMI 27.66 kg/m  Body mass index is 27.66 kg/m. General appearance: Well nourished, well developed female in no acute distress.  Neck:  Supple, normal appearance, and no thyromegaly  Cardiovascular: normal s1 and s2.  No murmurs, rubs or gallops. Respiratory:  Clear to auscultation bilateral. Normal respiratory effort Abdomen: positive bowel sounds and no masses, hernias; diffusely non tender to palpation, non distended Breasts: breasts appear normal, no suspicious masses, no skin or nipple changes or axillary nodes, and normal palpation. Neuro/Psych:  Normal mood and affect.  Skin:  Warm and dry. Hair on chest Lymphatic:  No inguinal lymphadenopathy.   Pelvic exam: is not limited by body habitus EGBUS: within normal limits, mild atrophy Vagina: within normal limits and with no blood or discharge in the vault. Normal cuff Adnexa:  normal adnexa and no mass, fullness, tenderness Rectovaginal: deferred  Laboratory: none  Radiology: none  Assessment: pt stable  Plan:  1. Hirsutism Check basic lab work. Needs PCP follow up - Hemoglobin A1c; Future - TSH; Future - Lipid panel; Future - DHEA-sulfate; Future - TSH+Prl+TestT+TestF+17OHP; Future  2. Well woman exam with routine gynecological exam Routine care.  - MM 3D SCREEN BREAST BILATERAL; Future - Hemoglobin A1c; Future - TSH; Future - Lipid panel; Future - DHEA-sulfate; Future - TSH+Prl+TestT+TestF+17OHP; Future - Comprehensive metabolic panel; Future  3. Hormone replacement therapy I d/w her need for baseline labwork first before consideration for HRT management. I d/w her  re: various options such as patch, pill, etc and potential risk of VTE, HTN, lipids, breast cancer and she would like to do it if it's safe.   4. Hot flashes due to surgical menopause  Orders Placed This Encounter  Procedures  . MM 3D SCREEN BREAST BILATERAL  . Hemoglobin A1c  . TSH  . Lipid panel  . DHEA-sulfate  . TSH+Prl+TestT+TestF+17OHP  . Comprehensive metabolic panel  . CBC    RTC based on lab work.   Durene Romans MD Attending [facility] for [employer] [facility])

## 2020-04-16 ENCOUNTER — Other Ambulatory Visit: Payer: Medicaid Other

## 2020-04-16 ENCOUNTER — Other Ambulatory Visit: Payer: Self-pay

## 2020-04-16 DIAGNOSIS — L680 Hirsutism: Secondary | ICD-10-CM

## 2020-04-16 DIAGNOSIS — Z01419 Encounter for gynecological examination (general) (routine) without abnormal findings: Secondary | ICD-10-CM

## 2020-04-22 ENCOUNTER — Telehealth: Payer: Self-pay

## 2020-04-22 LAB — LIPID PANEL
Chol/HDL Ratio: 6.7 ratio — ABNORMAL HIGH (ref 0.0–4.4)
Cholesterol, Total: 337 mg/dL — ABNORMAL HIGH (ref 100–199)
HDL: 50 mg/dL (ref >39)
LDL Chol Calc (NIH): 237 mg/dL — ABNORMAL HIGH (ref 0–99)
Triglycerides: 246 mg/dL — ABNORMAL HIGH (ref 0–149)
VLDL Cholesterol Cal: 50 mg/dL — ABNORMAL HIGH (ref 5–40)

## 2020-04-22 LAB — COMPREHENSIVE METABOLIC PANEL
ALT: 121 IU/L — ABNORMAL HIGH (ref 0–32)
AST: 114 IU/L — ABNORMAL HIGH (ref 0–40)
Albumin/Globulin Ratio: 2 (ref 1.2–2.2)
Albumin: 4.8 g/dL (ref 3.8–4.8)
Alkaline Phosphatase: 66 IU/L (ref 44–121)
BUN/Creatinine Ratio: 11 (ref 9–23)
BUN: 10 mg/dL (ref 6–24)
Bilirubin Total: 0.3 mg/dL (ref 0.0–1.2)
CO2: 26 mmol/L (ref 20–29)
Calcium: 10.2 mg/dL (ref 8.7–10.2)
Chloride: 100 mmol/L (ref 96–106)
Creatinine, Ser: 0.89 mg/dL (ref 0.57–1.00)
GFR calc Af Amer: 88 mL/min/{1.73_m2} (ref >59)
GFR calc non Af Amer: 76 mL/min/{1.73_m2} (ref >59)
Globulin, Total: 2.4 g/dL (ref 1.5–4.5)
Glucose: 236 mg/dL — ABNORMAL HIGH (ref 65–99)
Potassium: 4.3 mmol/L (ref 3.5–5.2)
Sodium: 140 mmol/L (ref 134–144)
Total Protein: 7.2 g/dL (ref 6.0–8.5)

## 2020-04-22 LAB — CBC
Hematocrit: 41.7 % (ref 34.0–46.6)
Hemoglobin: 13.6 g/dL (ref 11.1–15.9)
MCH: 27.1 pg (ref 26.6–33.0)
MCHC: 32.6 g/dL (ref 31.5–35.7)
MCV: 83 fL (ref 79–97)
Platelets: 185 10*3/uL (ref 150–450)
RBC: 5.02 x10E6/uL (ref 3.77–5.28)
RDW: 12.7 % (ref 11.7–15.4)
WBC: 6.1 10*3/uL (ref 3.4–10.8)

## 2020-04-22 LAB — DHEA-SULFATE: DHEA-SO4: 140 ug/dL (ref 41.2–243.7)

## 2020-04-22 LAB — TSH+PRL+TESTT+TESTF+17OHP
17-Hydroxyprogesterone: 22 ng/dL
Prolactin: 5.5 ng/mL (ref 4.8–23.3)
TSH: 1.57 u[IU]/mL (ref 0.450–4.500)
Testosterone, Free: 4.2 pg/mL (ref 0.0–4.2)
Testosterone, Total, LC/MS: 21.4 ng/dL

## 2020-04-22 LAB — HEMOGLOBIN A1C
Est. average glucose Bld gHb Est-mCnc: 260 mg/dL
Hgb A1c MFr Bld: 10.7 % — ABNORMAL HIGH (ref 4.8–5.6)

## 2020-04-22 NOTE — Telephone Encounter (Signed)
Pt left vm on office phone requesting lab results. Called and informed pt lab results have not been released and as soon as the labs are released, Dr Ilda Basset will be in touch. Pt verbalizes understanding.

## 2020-11-01 ENCOUNTER — Encounter (HOSPITAL_COMMUNITY): Payer: Self-pay

## 2020-11-01 ENCOUNTER — Other Ambulatory Visit: Payer: Self-pay

## 2020-11-01 ENCOUNTER — Ambulatory Visit (HOSPITAL_COMMUNITY)
Admission: EM | Admit: 2020-11-01 | Discharge: 2020-11-01 | Disposition: A | Discharge status: Home / Self Care | Payer: Medicaid Other | Attending: Family Medicine | Admitting: Family Medicine

## 2020-11-01 ENCOUNTER — Ambulatory Visit (INDEPENDENT_AMBULATORY_CARE_PROVIDER_SITE_OTHER): Payer: Medicaid Other

## 2020-11-01 DIAGNOSIS — J029 Acute pharyngitis, unspecified: Secondary | ICD-10-CM

## 2020-11-01 DIAGNOSIS — Z77120 Contact with and (suspected) exposure to mold (toxic): Secondary | ICD-10-CM

## 2020-11-01 DIAGNOSIS — R0602 Shortness of breath: Secondary | ICD-10-CM

## 2020-11-01 MED ORDER — PREDNISONE 20 MG PO TABS: 40.0000 mg | ORAL_TABLET | Freq: Every day | ORAL | 0 refills | Status: DC

## 2020-11-01 NOTE — ED Provider Notes (Signed)
[place]   [id number] 11/01/20 Arrival Time: [id number]  ASSESSMENT & PLAN:  1. Sore throat   2. Contact with or exposure to mold   3. SOB (shortness of breath)    I have personally viewed the imaging studies ordered this visit. CXR: normal.  Unclear etiology. Discussed Begin trial of: Meds ordered this encounter  Medications   predniSONE (DELTASONE) 20 MG tablet    Sig: Take 2 tablets (40 mg total) by mouth daily.    Dispense:  10 tablet    Refill:  0     Follow-up Information     [facility] .   Specialty: Emergency Medicine Why: If symptoms worsen in any way. Contact information: [address] [id number] [place] [phone]                Reviewed expectations re: course of current medical issues. Questions answered. Outlined signs and symptoms indicating need for more acute intervention. Understanding verbalized. After Visit Summary given.   SUBJECTIVE: History from: patient. Victoria Ali is a 50 y.o. female who reports feeling SOB at times with ST since finding mold in her home. Seen at [facility]; I do not have those records. "Told me it may be mold". No CP. Ques wheezing at times. Denies: fever. Normal PO intake without n/v/d.   OBJECTIVE:  Vitals:   11/01/20 1001  BP: 134/77  Pulse: 94  Resp: 18  Temp: 98.4 F (36.9 C)  TempSrc: Oral  SpO2: 99%    General appearance: alert; no distress Eyes: PERRLA; EOMI; conjunctiva normal HENT: [place]; AT; with mild nasal congestion; throat mild irritation Neck: supple  Lungs: speaks full sentences without difficulty; unlabored; CTAB Extremities: no edema Skin: warm and dry Neurologic: normal gait Psychological: alert and cooperative; normal mood and affect  Imaging: DG Chest 2 View  Result Date: 11/01/2020 CLINICAL DATA:  Shortness of breath, mold exposure. EXAM: CHEST - 2 VIEW COMPARISON:  10/29/2020 and CT  chest 03/19/2013. FINDINGS: Trachea is midline. Heart size normal. Lungs are clear. No pleural fluid. IMPRESSION: Negative. Electronically Signed   By: Lorin Picket M.D.   On: 11/01/2020 10:24    Allergies  Allergen Reactions   Spironolactone Itching   Keflex [Cephalexin] Nausea And Vomiting   Shellfish Allergy Hives    Past Medical History:  Diagnosis Date   Depression    Diabetes mellitus without complication ([facility])    IBS (irritable bowel syndrome)    Lymphoma ([place])    Renal stones    Social History   Socioeconomic History   Marital status: Single    Spouse name: Not on file   Number of children: Not on file   Years of education: Not on file   Highest education level: Not on file  Occupational History   Not on file  Tobacco Use   Smoking status: Never   Smokeless tobacco: Never  Vaping Use   Vaping Use: Never used  Substance and Sexual Activity   Alcohol use: No   Drug use: No   Sexual activity: Yes    Birth control/protection: Surgical  Other Topics Concern   Not on file  Social History Narrative   Not on file   Social Determinants of Health   Financial Resource Strain: Not on file  Food Insecurity: Not on file  Transportation Needs: Not on file  Physical Activity: Not on file  Stress: Not on file  Social Connections: Not on file  Intimate  Partner Violence: Not on file   Family History  Problem Relation Age of Onset   Diabetes Mother    Breast cancer Maternal Grandmother    Past Surgical History:  Procedure Laterality Date   LAPAROSCOPY     LITHOTRIPSY     LITHOTRIPSY     TOTAL VAGINAL HYSTERECTOMY  2007   with BSO. for pelvic pain     Vanessa Kick, MD 11/01/20 1217

## 2020-11-01 NOTE — ED Triage Notes (Addendum)
Pt states she had mold exposure on Friday, went to another health care facility to be seen that day. Pt states symptoms still not resolved, states she has SOB, runny nose, cough, sore throat and sneeze. At this time patient displays no signs of respiratory distress. Pt c/o medial chest pain, does not radiate, denies vision changes but does have a headache.

## 2020-11-01 NOTE — Discharge Instructions (Addendum)
Be sure to monitor you blood sugars closely. They will go up while on prednisone.

## 2020-11-01 NOTE — ED Notes (Signed)
EKG given to provider 

## 2020-11-04 ENCOUNTER — Emergency Department (HOSPITAL_COMMUNITY): Payer: Medicaid Other

## 2020-11-04 ENCOUNTER — Other Ambulatory Visit: Payer: Self-pay

## 2020-11-04 ENCOUNTER — Emergency Department (HOSPITAL_COMMUNITY)
Admission: EM | Admit: 2020-11-04 | Discharge: 2020-11-05 | Disposition: A | Discharge status: Home / Self Care | Payer: Medicaid Other | Attending: Emergency Medicine | Admitting: Emergency Medicine

## 2020-11-04 ENCOUNTER — Encounter (HOSPITAL_COMMUNITY): Payer: Self-pay | Admitting: Emergency Medicine

## 2020-11-04 DIAGNOSIS — R0602 Shortness of breath: Secondary | ICD-10-CM | POA: Diagnosis not present

## 2020-11-04 DIAGNOSIS — N898 Other specified noninflammatory disorders of vagina: Secondary | ICD-10-CM | POA: Diagnosis not present

## 2020-11-04 DIAGNOSIS — L292 Pruritus vulvae: Secondary | ICD-10-CM | POA: Insufficient documentation

## 2020-11-04 DIAGNOSIS — Z5321 Procedure and treatment not carried out due to patient leaving prior to being seen by health care provider: Secondary | ICD-10-CM | POA: Diagnosis not present

## 2020-11-04 NOTE — ED Triage Notes (Signed)
Patient here with vaginal itching and some discharge.  She also states that she has been having mold exposure and having some shortness of breath.  Patient is able to speak in full sentences.

## 2020-11-04 NOTE — ED Provider Notes (Signed)
Emergency Medicine Provider Triage Evaluation Note  Victoria Ali , a 50 y.o. female  was evaluated in triage.  Pt complains of vaginal itching.  Had intercourse a few days ago with boyfriend of 9 years and now has itching.  Has chronic discharge but seems worse now.    Began complaining of SOB after arrival in the ED.  States she has mold in her home so has been staying with her mother but had to go back to the house today to get some clothes.  Has been seen twice in the past week for similar complaints.  Denies fever or cough.  Review of Systems  Positive: Vaginal itching, SOB Negative: Cough, fever  Physical Exam  BP 121/84 (BP Location: Left Arm)   Pulse 95   Temp 97.8 F (36.6 C) (Oral)   Resp 16   SpO2 98%  Gen:   Awake, no distress   Resp:  Normal effort  MSK:   Moves extremities without difficulty  Other:  Lungs CTAB, no audible wheezing, speaking in paragraphs during triage, O2 98%  Medical Decision Making  Medically screening exam initiated at 11:32 PM.  Appropriate orders placed.  Victoria Ali was informed that the remainder of the evaluation will be completed by another provider, this initial triage assessment does not replace that evaluation, and the importance of remaining in the ED until their evaluation is complete.  Will obtain UA w/pregnancy test.  Had full work-up earlier in the week for SOB following mold exposure along with subsequent visit at [facility].  No respiratory distress in triage.  Will obtain CXR to ensure no PNA.   Larene Pickett, PA-C 11/04/20 [id number]    Veryl Speak, MD 11/05/20 [phone]

## 2020-11-05 NOTE — ED Notes (Signed)
Pt did not respond when called for vitals check X2

## 2020-11-05 NOTE — ED Notes (Signed)
Pt did not respond when called for "last call" vitals check

## 2021-02-17 ENCOUNTER — Encounter: Payer: Self-pay | Admitting: Radiology

## 2022-03-17 ENCOUNTER — Ambulatory Visit (HOSPITAL_COMMUNITY)
Admission: EM | Admit: 2022-03-17 | Discharge: 2022-03-17 | Discharge status: Against Medical Advice | Payer: Medicaid Other

## 2022-03-17 NOTE — ED Notes (Signed)
No answer from lobby  

## 2022-03-17 NOTE — ED Notes (Signed)
No answer x1

## 2023-06-04 ENCOUNTER — Encounter (HOSPITAL_COMMUNITY): Payer: Self-pay

## 2023-06-04 ENCOUNTER — Ambulatory Visit (HOSPITAL_COMMUNITY)
Admission: EM | Admit: 2023-06-04 | Discharge: 2023-06-04 | Disposition: A | Discharge status: Home / Self Care | Attending: Family Medicine | Admitting: Family Medicine

## 2023-06-04 DIAGNOSIS — A084 Viral intestinal infection, unspecified: Secondary | ICD-10-CM

## 2023-06-04 MED ORDER — ONDANSETRON 4 MG PO TBDP: 4.0000 mg | ORAL_TABLET | Freq: Three times a day (TID) | ORAL | 0 refills | Status: AC | PRN

## 2023-06-04 NOTE — ED Triage Notes (Signed)
 Patient states she ate some fish 2-days ago and started with nausea. No diarrhea. No headache.

## 2023-06-04 NOTE — Discharge Instructions (Signed)
Ondansetron dissolved in the mouth every 8 hours as needed for nausea or vomiting. Clear liquids(water, gatorade/pedialyte, ginger ale/sprite, chicken broth/soup) and bland things(crackers/toast, rice, potato, bananas) to eat. Avoid acidic foods like lemon/lime/orange/tomato, and avoid greasy/spicy foods.

## 2023-06-04 NOTE — ED Notes (Signed)
 No answer in lobby.
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
